# Patient Record
Sex: Male | Born: 1970
Health system: Southern US, Community
[De-identification: ages and names within clinical notes are randomized; demographics above are authoritative.]

## PROBLEM LIST (undated history)

## (undated) DIAGNOSIS — K649 Unspecified hemorrhoids: Secondary | ICD-10-CM

## (undated) DIAGNOSIS — R809 Proteinuria, unspecified: Secondary | ICD-10-CM

## (undated) DIAGNOSIS — E663 Overweight: Secondary | ICD-10-CM

## (undated) DIAGNOSIS — M2550 Pain in unspecified joint: Secondary | ICD-10-CM

## (undated) DIAGNOSIS — E7800 Pure hypercholesterolemia, unspecified: Secondary | ICD-10-CM

## (undated) DIAGNOSIS — I10 Essential (primary) hypertension: Secondary | ICD-10-CM

## (undated) DIAGNOSIS — E790 Hyperuricemia without signs of inflammatory arthritis and tophaceous disease: Secondary | ICD-10-CM

## (undated) DIAGNOSIS — M1310 Monoarthritis, not elsewhere classified, unspecified site: Secondary | ICD-10-CM

## (undated) HISTORY — DX: Overweight: E66.3

## (undated) HISTORY — DX: Essential (primary) hypertension: I10

## (undated) HISTORY — DX: Hyperuricemia without signs of inflammatory arthritis and tophaceous disease: E79.0

## (undated) HISTORY — DX: Unspecified hemorrhoids: K64.9

## (undated) HISTORY — DX: Monoarthritis, not elsewhere classified, unspecified site: M13.10

## (undated) HISTORY — DX: Pain in unspecified joint: M25.50

## (undated) HISTORY — DX: Proteinuria, unspecified: R80.9

## (undated) HISTORY — DX: Pure hypercholesterolemia, unspecified: E78.00

---

## 2019-01-03 ENCOUNTER — Telehealth: Payer: Self-pay | Admitting: *Deleted

## 2019-01-03 ENCOUNTER — Telehealth: Payer: Self-pay | Admitting: Interventional Cardiology

## 2019-01-03 NOTE — Telephone Encounter (Signed)
Contact pt about upcoming appt with Dr. Katrinka Blazing on 4/24.  Need to change to a virtual visit or move out if pt is doing ok.  Left message to call back to discuss.  Can be changed to a Doximity visit on the 24th at 11A or 12:30P if still available.

## 2019-01-03 NOTE — Telephone Encounter (Signed)
NOTES ON FILE FROM DR. RONALD POLITE [phone].

## 2019-01-04 NOTE — Telephone Encounter (Signed)
Left message to call back  

## 2019-01-05 NOTE — Telephone Encounter (Signed)
Patient set up for MyChart? Declined at this time  Is patient using Smartphone/computer/tablet? smartphone  Did audio/video work?  Does patient need telephone visit?no  Best phone number to use? [phone]  Patient does not have a bp machine at home , will have wt and  Medications ready     Virtual Visit Pre-Appointment Phone Call  "(Name), I am calling you today to discuss your upcoming appointment. We are currently trying to limit exposure to the virus that causes COVID-19 by seeing patients at home rather than in the office."  1. "What is the BEST phone number to call the day of the visit?" - include this in appointment notes  2. Do you have or have access to (through a family member/friend) a smartphone with video capability that we can use for your visit?" a. If yes - list this number in appt notes as cell (if different from BEST phone #) and list the appointment type as a VIDEO visit in appointment notes b. If no - list the appointment type as a PHONE visit in appointment notes  3. Confirm consent - "In the setting of the current Covid19 crisis, you are scheduled for a (phone or video) visit with your provider on (date) at (time).  Just as we do with many in-office visits, in order for you to participate in this visit, we must obtain consent.  If you'd like, I can send this to your mychart (if signed up) or email for you to review.  Otherwise, I can obtain your verbal consent now.  All virtual visits are billed to your insurance company just like a normal visit would be.  By agreeing to a virtual visit, we'd like you to understand that the technology does not allow for your provider to perform an examination, and thus may limit your provider's ability to fully assess your condition. If your provider identifies any concerns that need to be evaluated in person, we will make arrangements to do so.  Finally, though the technology is pretty good, we cannot assure that it will always  work on either your or our end, and in the setting of a video visit, we may have to convert it to a phone-only visit.  In either situation, we cannot ensure that we have a secure connection.  Are you willing to proceed?" STAFF: Did the patient verbally acknowledge consent to telehealth visit? Document YES/NO here: Yes   4. Advise patient to be prepared - "Two hours prior to your appointment, go ahead and check your blood pressure, pulse, oxygen saturation, and your weight (if you have the equipment to check those) and write them all down. When your visit starts, your provider will ask you for this information. If you have an Apple Watch or Kardia device, please plan to have heart rate information ready on the day of your appointment. Please have a pen and paper handy nearby the day of the visit as well."  5. Give patient instructions for MyChart download to smartphone OR Doximity/Doxy.me as below if video visit (depending on what platform provider is using)  6. Inform patient they will receive a phone call 15 minutes prior to their appointment time (may be from unknown caller ID) so they should be prepared to answer    TELEPHONE CALL NOTE  Jason [name] has been deemed a candidate for a follow-up tele-health visit to limit community exposure during the Covid-19 pandemic. I spoke with the patient via phone to ensure availability of phone/video source, confirm  preferred email & phone number, and discuss instructions and expectations.  I reminded Jason [name] to be prepared with any vital sign and/or heart rhythm information that could potentially be obtained via home monitoring, at the time of his visit. I reminded Jason [name] to expect a phone call prior to his visit.  Jason [name] 01/05/2019 9:16 AM   INSTRUCTIONS FOR DOWNLOADING THE MYCHART APP TO SMARTPHONE  - The patient must first make sure to have activated MyChart and know their login information - If Apple, go to [employer] and  type in MyChart in the search bar and download the app. If Android, ask patient to go to [employer] and type in [place] in the search bar and download the app. The app is free but as with any other app downloads, their phone may require them to verify saved payment information or Apple/Android password.  - The patient will need to then log into the app with their MyChart username and password, and select [facility] as their healthcare provider to link the account. When it is time for your visit, go to the MyChart app, find appointments, and click Begin Video Visit. Be sure to Select Allow for your device to access the Microphone and Camera for your visit. You will then be connected, and your provider will be with you shortly.  **If they have any issues connecting, or need assistance please contact MyChart service desk [phone])**  **If using a computer, in order to ensure the best quality for their visit they will need to use either of the following Internet Browsers: [employer] or Google Chrome**  IF USING DOXIMITY or DOXY.ME - The patient will receive a link just prior to their visit by text.     FULL LENGTH CONSENT FOR TELE-HEALTH VISIT   I hereby voluntarily request, consent and authorize [facility] and its employed or contracted physicians, physician assistants, nurse practitioners or other licensed health care professionals (the Practitioner), to provide me with telemedicine health care services (the Services") as deemed necessary by the treating Practitioner. I acknowledge and consent to receive the Services by the Practitioner via telemedicine. I understand that the telemedicine visit will involve communicating with the Practitioner through live audiovisual communication technology and the disclosure of certain medical information by electronic transmission. I acknowledge that I have been given the opportunity to request an in-person assessment or other  available alternative prior to the telemedicine visit and am voluntarily participating in the telemedicine visit.  I understand that I have the right to withhold or withdraw my consent to the use of telemedicine in the course of my care at any time, without affecting my right to future care or treatment, and that the Practitioner or I may terminate the telemedicine visit at any time. I understand that I have the right to inspect all information obtained and/or recorded in the course of the telemedicine visit and may receive copies of available information for a reasonable fee.  I understand that some of the potential risks of receiving the Services via telemedicine include:   Delay or interruption in medical evaluation due to technological equipment failure or disruption;  Information transmitted may not be sufficient (e.g. poor resolution of images) to allow for appropriate medical decision making by the Practitioner; and/or   In rare instances, security protocols could fail, causing a breach of personal health information.  Furthermore, I acknowledge that it is my responsibility to provide information about my medical history, conditions and care  that is complete and accurate to the best of my ability. I acknowledge that Practitioner's advice, recommendations, and/or decision may be based on factors not within their control, such as incomplete or inaccurate data provided by me or distortions of diagnostic images or specimens that may result from electronic transmissions. I understand that the practice of medicine is not an exact science and that Practitioner makes no warranties or guarantees regarding treatment outcomes. I acknowledge that I will receive a copy of this consent concurrently upon execution via email to the email address I last provided but may also request a printed copy by calling the office of [facility].    I understand that my insurance will be billed for this visit.   I have  read or had this consent read to me.  I understand the contents of this consent, which adequately explains the benefits and risks of the Services being provided via telemedicine.   I have been provided ample opportunity to ask questions regarding this consent and the Services and have had my questions answered to my satisfaction.  I give my informed consent for the services to be provided through the use of telemedicine in my medical care  By participating in this telemedicine visit I agree to the above.

## 2019-01-05 NOTE — Progress Notes (Addendum)
Virtual Visit via Video Note   This visit type was conducted due to national recommendations for restrictions regarding the COVID-19 Pandemic (e.g. social distancing) in an effort to limit this patient's exposure and mitigate transmission in our community.  Due to his co-morbid illnesses, this patient is at least at moderate risk for complications without adequate follow up.  This format is felt to be most appropriate for this patient at this time.  All issues noted in this document were discussed and addressed.  A limited physical exam was performed with this format.  Please refer to the patient's chart for his consent to telehealth for [facility].   Evaluation Performed:  Follow-up visit  Date:  07/19/2019   ID:  Jason [name], DOB Mar 06, 1971, MRN [id number]  Patient Location: Home Provider Location: Office  PCP:  Renford Dills, MD  Cardiologist:  No primary care provider on file.  Electrophysiologist:  None   Chief Complaint:  Fam Hx CAD  History of Present Illness:    Jason [name] is a 48 y.o. male with Moderate obesity, hyperlipidemia, hypertension, and positive family history of CAD (father-deceased mother- asymptomatic).  Jason [name] has no cardiac complaints.  He has a positive family history of CAD.  He is a father of 63 child, 59 years old.  He does not smoke.  He has multiple risk factors including metabolic syndrome, hypertension, hyperlipidemia, and family history.  He is obese and relatively sedentary.  He is motivated and is on medication to control both blood pressure and lipids.  He is not excepting the potential that he has diabetes even though his hemoglobin A1c is 6.5.  The patient does not have symptoms concerning for COVID-19 infection (fever, chills, cough, or new shortness of breath).    Past Medical History:  Diagnosis Date  . Arthralgia    CONCERNS OF GOUT  . Elevated blood uric acid level   . Hemorrhoids   . Hypercholesteremia   . Hypertension   .  Monoarticular arthritis   . Overweight   . Protein in urine    AGE 42  . Pure hypercholesterolemia       Current Meds  Medication Sig  . amLODipine-olmesartan (AZOR) 5-20 MG tablet Take 1 tablet by mouth daily.  Marland Kitchen aspirin EC 81 MG tablet Take 81 mg by mouth daily.  . Omega-3 Fatty Acids (FISH OIL) 1000 MG CAPS Take 1,000 mg by mouth.  . rosuvastatin (CRESTOR) 10 MG tablet Take 10 mg by mouth daily.     Allergies:   Patient has no known allergies.   Social History   Tobacco Use  . Smoking status: Never Smoker  . Smokeless tobacco: Never Used  Substance Use Topics  . Alcohol use: Yes    Comment: SOCIALLY  . Drug use: Never     Family Hx: The patient's family history includes CAD in his father; Healthy in his brother, daughter, sister, and son.  ROS:   Please see the history of present illness.    Elevated uric acid and history of gout. All other systems reviewed and are negative.   Prior CV studies:   The following studies were reviewed today:  No imaging or functional data.  Labs/Other Tests and Data Reviewed:    EKG:  No ECG reviewed.  Recent Labs: No results found for requested labs within last 8760 hours.   Recent Lipid Panel No results found for: CHOL, TRIG, HDL, CHOLHDL, LDLCALC, LDLDIRECT  Wt Readings from Last 3 Encounters:  01/06/19 225 lb (  102.1 kg)     Objective:    Vital Signs:  BP 133/84   Pulse 78   Ht 5\' 9"  (1.753 m)   Wt 225 lb (102.1 kg)   BMI 33.23 kg/m    VITAL SIGNS:  reviewed GEN:  no acute distress RESPIRATORY:  normal respiratory effort, symmetric expansion NEURO:  alert and oriented x 3, no obvious focal deficit  ASSESSMENT & PLAN:    1. Essential hypertension   2. Family history of early CAD   3. Hyperlipidemia with target LDL less than 70   4. Metabolic syndrome   5. Educated About Covid-19 Virus Infection    PLAN:  1. Target 130/80 mmHg or less. 2. Strong family history of hyperlipidemia and coronary artery  disease.  Primary prevention is our goal. 3. LDL target less than 70. 4. Increase aerobic activity and weight loss are encouraged. 5. The 3W's were discussed.  Overall education and awareness concerning primary risk prevention was discussed in detail: LDL less than 70, hemoglobin A1c less than 7, blood pressure target less than 130/80 mmHg, >150 minutes of moderate aerobic activity per week, avoidance of smoking, weight control (via diet and exercise), and continued surveillance/management of/for obstructive sleep apnea.   COVID-19 Education: The signs and symptoms of COVID-19 were discussed with the patient and how to seek care for testing (follow up with PCP or arrange E-visit).  The importance of social distancing was discussed today.  Time:   Today, I have spent 20 minutes with the patient with telehealth technology discussing the above problems.     Medication Adjustments/Labs and Tests Ordered: Current medicines are reviewed at length with the patient today.  Concerns regarding medicines are outlined above.   Tests Ordered: No orders of the defined types were placed in this encounter.   Medication Changes: No orders of the defined types were placed in this encounter.   Disposition:  Follow up in 1 year(s)  Signed, Lesleigh NoeHenry W Gizzelle Lacomb III, MD  07/19/2019 12:52 PM    [facility]

## 2019-01-06 ENCOUNTER — Telehealth (INDEPENDENT_AMBULATORY_CARE_PROVIDER_SITE_OTHER): Payer: Managed Care, Other (non HMO) | Admitting: Interventional Cardiology

## 2019-01-06 ENCOUNTER — Other Ambulatory Visit: Payer: Self-pay

## 2019-01-06 VITALS — BP 133/84 | HR 78 | Ht 69.0 in | Wt 225.0 lb

## 2019-01-06 DIAGNOSIS — E785 Hyperlipidemia, unspecified: Secondary | ICD-10-CM | POA: Diagnosis not present

## 2019-01-06 DIAGNOSIS — Z8249 Family history of ischemic heart disease and other diseases of the circulatory system: Secondary | ICD-10-CM

## 2019-01-06 DIAGNOSIS — I10 Essential (primary) hypertension: Secondary | ICD-10-CM | POA: Diagnosis not present

## 2019-01-06 DIAGNOSIS — E8881 Metabolic syndrome: Secondary | ICD-10-CM | POA: Diagnosis not present

## 2019-01-06 DIAGNOSIS — Z7189 Other specified counseling: Secondary | ICD-10-CM

## 2019-01-06 NOTE — Patient Instructions (Signed)
Medication Instructions:  Your physician recommends that you continue on your current medications as directed. Please refer to the Current Medication list given to you today.  If you need a refill on your cardiac medications before your next appointment, please call your pharmacy.   Lab work: None If you have labs (blood work) drawn today and your tests are completely normal, you will receive your results only by: Marland Kitchen MyChart Message (if you have MyChart) OR . A paper copy in the mail If you have any lab test that is abnormal or we need to change your treatment, we will call you to review the results.  Testing/Procedures: None  Follow-Up:  Your physician recommends that you schedule a follow-up appointment before the end of the year with one of our nurse practitioners so we can obtain an EKG and in person evaluation.    At [facility], you and your health needs are our priority.  As part of our continuing mission to provide you with exceptional heart care, we have created designated Provider Care Teams.  These Care Teams include your primary Cardiologist (physician) and Advanced Practice Providers (APPs -  Physician Assistants and Nurse Practitioners) who all work together to provide you with the care you need, when you need it. You will need a follow up appointment in 12 months.  Please call our office 2 months in advance to schedule this appointment.  You may see Dr. Katrinka Blazing or one of the following Advanced Practice Providers on your designated Care Team:   Norma Fredrickson, NP Nada Boozer, NP . Georgie Chard, NP  Any Other Special Instructions Will Be Listed Below (If Applicable).

## 2019-01-06 NOTE — Telephone Encounter (Signed)
Late entry:  Called pt yesterday and left message that appt time is going to be 12:30pm as 9:40am was not an actual time slot.  Advised to call back if this didn't work for the pt.

## 2019-01-16 ENCOUNTER — Telehealth: Payer: Self-pay | Admitting: Interventional Cardiology

## 2019-01-16 NOTE — Telephone Encounter (Signed)
Called Dr. Idelle Crouch office in regards to recent labs we had received.  Spoke with Dennie Bible in medical records and she states that 2020 labs that are blank are because he will not have those drawn until June.  She transferred me to [facility], CMA for Dr. Nehemiah Settle to inquire about if anything had been done about abnormal labs in 2019.  Left detailed message on Darlene's VM.

## 2019-01-16 NOTE — Telephone Encounter (Signed)
Spoke with Carlyon Shadow and she states labs were repeated in Feb 2020.  She is going to resend them since everything did not come through correctly.  When labs were drawn in 2019, pt was changed from Atorvastatin to Rosuvastatin 32m QD.  Nothing done in regards to elevated Crea.  Prednisone and Colchicine given for elevared ESR and Uric Acid.

## 2019-04-25 ENCOUNTER — Telehealth: Payer: Self-pay | Admitting: *Deleted

## 2019-04-25 NOTE — Telephone Encounter (Signed)
lvm for pt that upcoming appt has been moved to the next day due to Virtual visits the day the pt was scheduled.

## 2019-05-16 ENCOUNTER — Ambulatory Visit: Payer: Self-pay | Admitting: Nurse Practitioner

## 2019-05-17 ENCOUNTER — Ambulatory Visit: Payer: Self-pay | Admitting: Nurse Practitioner

## 2019-07-19 NOTE — Progress Notes (Signed)
Cardiology Office Note:    Date:  07/20/2019   ID:  Jason [name], DOB 02-Jul-1971, MRN [id number]  PCP:  Seward Carol, MD  Cardiologist:  No primary care provider on file.   Referring MD: Seward Carol, MD   Chief Complaint  Patient presents with  . Hypertension  . Hyperlipidemia  . Advice Only    Family history of vascular disease    History of Present Illness:    Jason [name] is a 48 y.o. male with a hx of Moderate obesity, hyperlipidemia, hypertension, and positive family history of CAD (father-deceased mother- asymptomatic).Jason [name] is in today for an in person office visit.  The first encounter was several months ago as a virtual visit during the still existent Covid 19 pandemic.  He feels well and denies angina, chest pain, dyspnea on exertion, peripheral edema, palpitations, syncope, orthopnea, and physical activity intolerance.  He does snore.  He occasionally awakens at night to urinate.  He does not smoke cigarettes.  He does have the metabolic syndrome.  Most recent hemoglobin A1c was 6.5.  Past Medical History:  Diagnosis Date  . Arthralgia    CONCERNS OF GOUT  . Elevated blood uric acid level   . Hemorrhoids   . Hypercholesteremia   . Hypertension   . Monoarticular arthritis   . Overweight   . Protein in urine    AGE 48  . Pure hypercholesterolemia     History reviewed. No pertinent surgical history.  Current Medications: Current Meds  Medication Sig  . amLODipine-olmesartan (AZOR) 5-20 MG tablet Take 1 tablet by mouth daily.  Marland Kitchen aspirin EC 81 MG tablet Take 81 mg by mouth daily.  . Omega-3 Fatty Acids (FISH OIL) 1000 MG CAPS Take 1,000 mg by mouth.  . rosuvastatin (CRESTOR) 10 MG tablet Take 10 mg by mouth daily.     Allergies:   Patient has no known allergies.   Social History   Socioeconomic History  . Marital status: Married    Spouse name: Not on file  . Number of children: Not on file  . Years of education: Not on file  . Highest  education level: Not on file  Occupational History  . Not on file  Social Needs  . Financial resource strain: Not on file  . Food insecurity    Worry: Not on file    Inability: Not on file  . Transportation needs    Medical: Not on file    Non-medical: Not on file  Tobacco Use  . Smoking status: Never Smoker  . Smokeless tobacco: Never Used  Substance and Sexual Activity  . Alcohol use: Yes    Comment: SOCIALLY  . Drug use: Never  . Sexual activity: Not on file    Comment: MARRIED  Lifestyle  . Physical activity    Days per week: Not on file    Minutes per session: Not on file  . Stress: Not on file  Relationships  . Social Herbalist on phone: Not on file    Gets together: Not on file    Attends religious service: Not on file    Active member of club or organization: Not on file    Attends meetings of clubs or organizations: Not on file    Relationship status: Not on file  Other Topics Concern  . Not on file  Social History Narrative  . Not on file     Family History: The patient's family history includes CAD in  his father; Healthy in his brother, daughter, sister, and son.  ROS:   Please see the history of present illness.    Bilateral knee arthritis right greater than left.  All other systems reviewed and are negative.  EKGs/Labs/Other Studies Reviewed:    The following studies were reviewed today: No imaging data.  EKG:  EKG normal sinus rhythm with poor R wave progression V1 through V3.  Normal overall tracing.  No prior tracings available for comparison.  Recent Labs: No results found for requested labs within last 8760 hours.  Recent Lipid Panel No results found for: CHOL, TRIG, HDL, CHOLHDL, VLDL, LDLCALC, LDLDIRECT  Physical Exam:    VS:  BP (!) 144/84   Pulse 84   Ht 5\' 9"  (1.753 m)   Wt 218 lb (98.9 kg)   SpO2 97%   BMI 32.19 kg/m     Wt Readings from Last 3 Encounters:  07/20/19 218 lb (98.9 kg)  01/06/19 225 lb (102.1 kg)      GEN: Moderate obesity with BMI of 32.2 kg/m.Marland Kitchen. No acute distress HEENT: Normal NECK: No JVD. LYMPHATICS: No lymphadenopathy CARDIAC:  RRR without murmur, gallop, or edema. VASCULAR:  Normal Pulses. No bruits. RESPIRATORY:  Clear to auscultation without rales, wheezing or rhonchi  ABDOMEN: Soft, non-tender, non-distended, No pulsatile mass, MUSCULOSKELETAL: No deformity  SKIN: Warm and dry NEUROLOGIC:  Alert and oriented x 3 PSYCHIATRIC:  Normal affect   ASSESSMENT:    1. Essential hypertension   2. Hyperlipidemia with target LDL less than 70   3. Family history of early CAD   4. Metabolic syndrome   5. Snoring   6. Educated about COVID-19 virus infection    PLAN:    In order of problems listed above:  1. Blood pressure needs better control with target 130/80 mmHg.  He is using nonsteroidal anti-inflammatory therapy due to bilateral knee arthritis.  I have asked him to minimize the use of NSAIDs as much as possible, cut back on salt, continue losing weight, and exercise.  If over the next 2 to 3 months we are not consistently getting systolic pressures below 130, we will need to intensify his current regimen by adding low-dose diuretic therapy in the form of HCTZ. 2. He question the use of rosuvastatin because he read about statin therapy increasing glucose intolerance.  He is already diabetic and therefore should not be asked concerned about the use of statin therapy precipitating diabetes.  He should however have the same targets for control as a diabetic which is LDL less than 70.  We discussed the concept of burden of cholesterol exposure over time and he will clearly do better on statin therapy controlling for LDL 70 target 3. Father had multivessel coronary disease and required bypass surgery in his 4370s. 4. A1c most recently in February was 6.5 before 25 pound weight loss.  Hopefully this will be markedly improved when reevaluated by Dr. Nehemiah SettlePolite. 5. Schedule sleep study to rule  out obstructive sleep apnea.  Overall education and awareness concerning primary risk prevention was discussed in detail: LDL less than 70, hemoglobin A1c less than 7, blood pressure target less than 130/80 mmHg, >150 minutes of moderate aerobic activity per week, avoidance of smoking, weight control (via diet and exercise), and continued surveillance/management of/for obstructive sleep apnea.    Medication Adjustments/Labs and Tests Ordered: Current medicines are reviewed at length with the patient today.  Concerns regarding medicines are outlined above.  Orders Placed This Encounter  Procedures  .  EKG 12-Lead  . Split night study   No orders of the defined types were placed in this encounter.   Patient Instructions  Medication Instructions:  Your physician recommends that you continue on your current medications as directed. Please refer to the Current Medication list given to you today.  *If you need a refill on your cardiac medications before your next appointment, please call your pharmacy*  Lab Work: None If you have labs (blood work) drawn today and your tests are completely normal, you will receive your results only by: Marland Kitchen MyChart Message (if you have MyChart) OR . A paper copy in the mail If you have any lab test that is abnormal or we need to change your treatment, we will call you to review the results.  Testing/Procedures: Your physician has recommended that you have a sleep study. This test records several body functions during sleep, including: brain activity, eye movement, oxygen and carbon dioxide blood levels, heart rate and rhythm, breathing rate and rhythm, the flow of air through your mouth and nose, snoring, body muscle movements, and chest and belly movement.   Follow-Up: At [facility], you and your health needs are our priority.  As part of our continuing mission to provide you with exceptional heart care, we have created designated Provider Care Teams.   These Care Teams include your primary Cardiologist (physician) and Advanced Practice Providers (APPs -  Physician Assistants and Nurse Practitioners) who all work together to provide you with the care you need, when you need it.  Your next appointment:   12 months  The format for your next appointment:   In Person  Provider:   You may see Dr. Verdis Prime or one of the following Advanced Practice Providers on your designated Care Team:    Norma Fredrickson, NP  Nada Boozer, NP  Georgie Chard, NP   Other Instructions  Monitor your blood pressure at least twice a week for the next 6 weeks and contact the office with those readings.  Make sure you are taking the blood pressure at least 2 hours after your morning medication.      Signed, Lesleigh Noe, MD  07/20/2019 11:48 AM    [facility]

## 2019-07-20 ENCOUNTER — Encounter: Payer: Self-pay | Admitting: Interventional Cardiology

## 2019-07-20 ENCOUNTER — Ambulatory Visit: Payer: Managed Care, Other (non HMO) | Admitting: Interventional Cardiology

## 2019-07-20 ENCOUNTER — Telehealth: Payer: Self-pay | Admitting: *Deleted

## 2019-07-20 ENCOUNTER — Other Ambulatory Visit: Payer: Self-pay

## 2019-07-20 VITALS — BP 144/84 | HR 84 | Ht 69.0 in | Wt 218.0 lb

## 2019-07-20 DIAGNOSIS — Z8249 Family history of ischemic heart disease and other diseases of the circulatory system: Secondary | ICD-10-CM

## 2019-07-20 DIAGNOSIS — Z7189 Other specified counseling: Secondary | ICD-10-CM

## 2019-07-20 DIAGNOSIS — E8881 Metabolic syndrome: Secondary | ICD-10-CM | POA: Diagnosis not present

## 2019-07-20 DIAGNOSIS — E785 Hyperlipidemia, unspecified: Secondary | ICD-10-CM | POA: Diagnosis not present

## 2019-07-20 DIAGNOSIS — R0683 Snoring: Secondary | ICD-10-CM

## 2019-07-20 DIAGNOSIS — I10 Essential (primary) hypertension: Secondary | ICD-10-CM

## 2019-07-20 NOTE — Patient Instructions (Signed)
Medication Instructions:  Your physician recommends that you continue on your current medications as directed. Please refer to the Current Medication list given to you today.  *If you need a refill on your cardiac medications before your next appointment, please call your pharmacy*  Lab Work: None If you have labs (blood work) drawn today and your tests are completely normal, you will receive your results only by: Marland Kitchen MyChart Message (if you have MyChart) OR . A paper copy in the mail If you have any lab test that is abnormal or we need to change your treatment, we will call you to review the results.  Testing/Procedures: Your physician has recommended that you have a sleep study. This test records several body functions during sleep, including: brain activity, eye movement, oxygen and carbon dioxide blood levels, heart rate and rhythm, breathing rate and rhythm, the flow of air through your mouth and nose, snoring, body muscle movements, and chest and belly movement.   Follow-Up: At [facility], you and your health needs are our priority.  As part of our continuing mission to provide you with exceptional heart care, we have created designated Provider Care Teams.  These Care Teams include your primary Cardiologist (physician) and Advanced Practice Providers (APPs -  Physician Assistants and Nurse Practitioners) who all work together to provide you with the care you need, when you need it.  Your next appointment:   12 months  The format for your next appointment:   In Person  Provider:   You may see Dr. Daneen Schick or one of the following Advanced Practice Providers on your designated Care Team:    Truitt Merle, NP  Cecilie Kicks, NP  Kathyrn Drown, NP   Other Instructions  Monitor your blood pressure at least twice a week for the next 6 weeks and contact the office with those readings.  Make sure you are taking the blood pressure at least 2 hours after your morning medication.

## 2019-07-20 NOTE — Telephone Encounter (Signed)
-----   Message from Loren Racer, RN sent at 07/20/2019 12:31 PM EST ----- Sleep study ordered for pt

## 2019-07-25 ENCOUNTER — Telehealth: Payer: Self-pay | Admitting: *Deleted

## 2019-07-25 NOTE — Telephone Encounter (Signed)
PA for split night sleep study submitted to [facility] via web portal.

## 2019-08-14 ENCOUNTER — Telehealth: Payer: Self-pay | Admitting: *Deleted

## 2019-08-14 DIAGNOSIS — I10 Essential (primary) hypertension: Secondary | ICD-10-CM

## 2019-08-14 DIAGNOSIS — R0683 Snoring: Secondary | ICD-10-CM

## 2019-08-14 NOTE — Telephone Encounter (Signed)
Staff message sent to [facility] Griffith denied in lab sleep study. # provided for peer to peer.

## 2019-09-13 NOTE — Telephone Encounter (Signed)
Patient called and states Jason [name] authorized a home sleep study. Would that be ok?

## 2019-09-17 NOTE — Telephone Encounter (Signed)
Yes, home sleep study okay.

## 2019-09-19 NOTE — Addendum Note (Signed)
Addended by: Reesa Chew on: 09/19/2019 01:40 PM   Modules accepted: Orders

## 2019-09-28 NOTE — Telephone Encounter (Signed)
Informed patient of upcoming home sleep study and patient understanding was verbalized. Patient is aware of Home Sleep Study through [facility]. Patient is scheduled for 11/23/19 at 10 am to pick up home sleep kit and meet with Respiratory therapist at [address] [facility]. Patient is aware that if this appointment date and time does not work for them they should contact Artis Delay directly at [phone]. Patient is aware that a sleep packet will be sent from [facility] in week.  Left detailed message on voicemail with date and time of titration and informed patient to call back to confirm or reschedule.

## 2019-11-23 ENCOUNTER — Other Ambulatory Visit: Payer: Self-pay

## 2019-11-23 ENCOUNTER — Ambulatory Visit (HOSPITAL_BASED_OUTPATIENT_CLINIC_OR_DEPARTMENT_OTHER): Payer: Managed Care, Other (non HMO) | Attending: Cardiology | Admitting: Cardiology

## 2019-11-23 DIAGNOSIS — G4733 Obstructive sleep apnea (adult) (pediatric): Secondary | ICD-10-CM

## 2019-11-23 DIAGNOSIS — R0902 Hypoxemia: Secondary | ICD-10-CM | POA: Insufficient documentation

## 2019-11-23 DIAGNOSIS — I10 Essential (primary) hypertension: Secondary | ICD-10-CM | POA: Diagnosis not present

## 2019-11-23 DIAGNOSIS — R0683 Snoring: Secondary | ICD-10-CM | POA: Diagnosis present

## 2019-11-24 NOTE — Procedures (Signed)
  Patient Name: Jason [name], Jason [name] Date: 11/23/2019 Gender: Male D.O.B: [birth year] Age (years): 74 Referring Provider: Armanda Magic MD, ABSM Height (inches): 69 Interpreting Physician: Armanda Magic MD, ABSM Weight (lbs): 218 RPSGT: Celene Kras BMI: 32 MRN: [id number] Neck Size: 17.50  CLINICAL INFORMATION Sleep Study Type: HST  Indication for sleep study: N/A  Epworth Sleepiness Score: 2  SLEEP STUDY TECHNIQUE A multi-channel overnight portable sleep study was performed. The channels recorded were: nasal airflow, thoracic respiratory movement, and oxygen saturation with a pulse oximetry. Snoring was also monitored.  MEDICATIONS Patient self administered medications include: N/A.  SLEEP ARCHITECTURE Patient was studied for 395.3 minutes. The sleep efficiency was 99.0 % and the patient was supine for 66.6%. The arousal index was 0.0 per hour.  RESPIRATORY PARAMETERS The overall AHI was 14.0 per hour, with a central apnea index of 0.0 per hour.  The oxygen nadir was 81% during sleep.  CARDIAC DATA Mean heart rate during sleep was 68.4 bpm.  IMPRESSIONS - Mild obstructive sleep apnea occurred during this study (AHI = 14.0/h). - No significant central sleep apnea occurred during this study (CAI = 0.0/h). - Moderate oxygen desaturation was noted during this study (Min O2 = 81%). - Patient snored 7.5% during the sleep.  DIAGNOSIS - Obstructive Sleep Apnea (327.23 [G47.33 ICD-10]) - Nocturnal Hypoxemia (327.26 [G47.36 ICD-10])  RECOMMENDATIONS - Therapeutic CPAP titration to determine optimal pressure required to alleviate sleep disordered breathing. - Oral appliance may be considered. - Avoid alcohol, sedatives and other CNS depressants that may worsen sleep apnea and disrupt normal sleep architecture. - Sleep hygiene should be reviewed to assess factors that may improve sleep quality. - Weight management and regular exercise should be initiated or continued. -  Return to Sleep Center to discuss the results of this study  [Electronically signed] 11/24/2019 06:10 PM  Armanda Magic MD, ABSM Diplomate, American Board of Sleep Medicine

## 2019-11-28 ENCOUNTER — Telehealth: Payer: Self-pay | Admitting: *Deleted

## 2019-11-28 NOTE — Telephone Encounter (Signed)

## 2019-11-28 NOTE — Telephone Encounter (Addendum)
Informed patient of sleep study results. Patient understands his sleep study showed they have sleep apnea. Patient understands Dr Mayford Knife wants to set up a virtual OV to discuss results and possible treatment options. Appointment set up for 12/13/19. Pt is aware and agreeable to the appointment.

## 2019-11-28 NOTE — Telephone Encounter (Signed)
-----   Message from Quintella Reichert, MD sent at 11/24/2019  6:13 PM EST ----- Please let patient know that they have sleep apnea.  Please set up virtual OV to discuss results and possible treatment options

## 2019-12-13 ENCOUNTER — Telehealth (INDEPENDENT_AMBULATORY_CARE_PROVIDER_SITE_OTHER): Payer: Managed Care, Other (non HMO) | Admitting: Cardiology

## 2019-12-13 ENCOUNTER — Telehealth: Payer: Self-pay | Admitting: *Deleted

## 2019-12-13 ENCOUNTER — Encounter: Payer: Self-pay | Admitting: Cardiology

## 2019-12-13 ENCOUNTER — Other Ambulatory Visit: Payer: Self-pay

## 2019-12-13 VITALS — Ht 69.0 in | Wt 220.0 lb

## 2019-12-13 DIAGNOSIS — I10 Essential (primary) hypertension: Secondary | ICD-10-CM

## 2019-12-13 DIAGNOSIS — E669 Obesity, unspecified: Secondary | ICD-10-CM

## 2019-12-13 DIAGNOSIS — G4733 Obstructive sleep apnea (adult) (pediatric): Secondary | ICD-10-CM | POA: Diagnosis not present

## 2019-12-13 NOTE — Progress Notes (Signed)
Virtual Visit via Telephone Note   This visit type was conducted due to national recommendations for restrictions regarding the COVID-19 Pandemic (e.g. social distancing) in an effort to limit this patient's exposure and mitigate transmission in our community.  Due to his co-morbid illnesses, this patient is at least at moderate risk for complications without adequate follow up.  This format is felt to be most appropriate for this patient at this time.  The patient did not have access to video technology/had technical difficulties with video requiring transitioning to audio format only (telephone).  All issues noted in this document were discussed and addressed.  No physical exam could be performed with this format.  Please refer to the patient's chart for his  consent to telehealth for [facility].   Evaluation Performed:  Follow-up visit  This visit type was conducted due to national recommendations for restrictions regarding the COVID-19 Pandemic (e.g. social distancing).  This format is felt to be most appropriate for this patient at this time.  All issues noted in this document were discussed and addressed.  No physical exam was performed (except for noted visual exam findings with Video Visits).  Please refer to the patient's chart (MyChart message for video visits and phone note for telephone visits) for the patient's consent to telehealth for [facility]  Date:  12/13/2019   ID:  Jason [name], DOB [birth year], MRN [id number]  Patient Location:  Home  Provider location:   [place]  PCP:  Seward Carol, MD  Cardiologist:  Daneen Schick, MD Sleep Medicine:  Fransico Him, MD Electrophysiologist:  None   Chief Complaint:  Snoring  History of Present Illness:    Jason [name] is a 49 y.o. male who presents via audio/video conferencing for a telehealth visit today.    This is a 49yo male with a hx of obesity, HLD, HTN followed by Dr. Tamala Julian.  Due to problems with snoring he  was referred for sleep study which showed mild to moderate OSA with an AHI of 14/hr and O2 saturations as low as 81% with nocturnal hypoxemia.  He is now here to discuss the results.  He tells me that he has not had any problems with awakening gasping for breath but his wife has witnessed apneic episodes while sleeping.   The patient does not have symptoms concerning for COVID-19 infection (fever, chills, cough, or new shortness of breath).   Prior CV studies:   The following studies were reviewed today:  Sleep study  Past Medical History:  Diagnosis Date  . Arthralgia    CONCERNS OF GOUT  . Elevated blood uric acid level   . Hemorrhoids   . Hypercholesteremia   . Hypertension   . Monoarticular arthritis   . Overweight   . Protein in urine    AGE 50  . Pure hypercholesterolemia    No past surgical history on file.   Current Meds  Medication Sig  . amLODipine-olmesartan (AZOR) 5-20 MG tablet Take 1 tablet by mouth daily.  Marland Kitchen aspirin EC 81 MG tablet Take 81 mg by mouth daily.  . Omega-3 Fatty Acids (FISH OIL) 1000 MG CAPS Take 1,000 mg by mouth.  . rosuvastatin (CRESTOR) 10 MG tablet Take 10 mg by mouth daily.     Allergies:   Patient has no known allergies.   Social History   Tobacco Use  . Smoking status: Never Smoker  . Smokeless tobacco: Never Used  Substance Use Topics  . Alcohol use: Yes  Comment: SOCIALLY  . Drug use: Never     Family Hx: The patient's family history includes CAD in his father; Healthy in his brother, daughter, sister, and son.  ROS:   Please see the history of present illness.     All other systems reviewed and are negative.   Labs/Other Tests and Data Reviewed:    Recent Labs: No results found for requested labs within last 8760 hours.   Recent Lipid Panel No results found for: CHOL, TRIG, HDL, CHOLHDL, LDLCALC, LDLDIRECT  Wt Readings from Last 3 Encounters:  12/13/19 220 lb (99.8 kg)  11/23/19 219 lb (99.3 kg)  07/20/19 218  lb (98.9 kg)     Objective:    Vital Signs:  Ht 5\' 9"  (1.753 m)   Wt 220 lb (99.8 kg)   BMI 32.49 kg/m     ASSESSMENT & PLAN:    1.  OSA -he has mild to moderate OSA with an AHI of 14/hr and associated nocturnal hypoxemia with lowest O2 sat of 81%.   -he has had problems with snoring and witnessed apnea by his wife -I reviewed the mechanism of sleep apnea and its association with HTN, heart disease, pulmonary HTN, CHF, DM and CVA -given his nocturnal hypoxemia, I have recommended that we proceed with CPAP titration  2.  Obesity -I have encouraged him to get into a routine exercise program and cut back on carbs and portions.   3.  HTN -continue Amlodipine-Olmesartan 5-20mg  daily  COVID-19 Education: The signs and symptoms of COVID-19 were discussed with the patient and how to seek care for testing (follow up with PCP or arrange E-visit).  The importance of social distancing was discussed today.  Patient Risk:   After full review of this patient's clinical status, I feel that they are at least moderate risk at this time.  Time:   Today, I have spent 20 minutes on telemedeicine discussing medical problems including OSA, HTN, obesity and reviewing patient's chart including sleep study.  Medication Adjustments/Labs and Tests Ordered: Current medicines are reviewed at length with the patient today.  Concerns regarding medicines are outlined above.  Tests Ordered: No orders of the defined types were placed in this encounter.  Medication Changes: No orders of the defined types were placed in this encounter.   Disposition:  Follow up PRN after CPAP titration  Signed, , MD  12/13/2019 8:39 AM    [facility]

## 2019-12-13 NOTE — Telephone Encounter (Signed)
PA request faxed to [facility] for CPAP titration study.

## 2019-12-13 NOTE — Telephone Encounter (Signed)
  Theresia Majors, RN  Reesa Chew, CMA  From Dr. Mayford Knife,   "please send to [facility] to order a CPAp titration - PRN"

## 2019-12-13 NOTE — Telephone Encounter (Signed)
cpap titration sent to sleep pool 

## 2019-12-14 ENCOUNTER — Telehealth: Payer: Self-pay | Admitting: *Deleted

## 2019-12-14 DIAGNOSIS — G4733 Obstructive sleep apnea (adult) (pediatric): Secondary | ICD-10-CM

## 2019-12-14 NOTE — Telephone Encounter (Signed)
-----   Message from Quintella Reichert, MD sent at 12/14/2019  5:04 PM EDT ----- Regarding: RE: precert Order [facility] CPAP on auto from 4 to 18cm H2O with heated humidity and mask of choice  Traci Turner ----- Message ----- From: Reesa Chew, CMA Sent: 12/14/2019   4:49 PM EDT To: Quintella Reichert, MD Subject: FW: precert                                    CPAP titration DENIED ok'd APAP. Please write settings. Thanks ----- Message ----- From: Gaynelle Cage, CMA Sent: 12/14/2019  10:37 AM EDT To: Reesa Chew, CMA Subject: RE: precert                                    [employer] denied CPAP titration study. Notify ordering provider. May need to do APAP.  ----- Message ----- From: Reesa Chew, CMA Sent: 12/13/2019  10:00 AM EDT To: [facility] Sleep Studies Subject: precert                                        CPAP titration

## 2019-12-14 NOTE — Telephone Encounter (Signed)
Staff message sent to [facility] request for CPAP titration has been denied by [place]. Notify ordering provider. Can do peer to peer or order APAP.

## 2019-12-14 NOTE — Telephone Encounter (Addendum)
DME selection is [facility]. Patient understands he will be contacted by [facility] to set up his cpap. Patient understands to call if [facility] does not contact him with new setup in a timely manner. Patient understands they will be called once confirmation has been received from [facility] that they have received their new machine to schedule 10 week follow up appointment.  [facility] notified of new cpap order  Please add to airview Patient was grateful for the call and thanked me.

## 2020-11-28 DIAGNOSIS — Z23 Encounter for immunization: Secondary | ICD-10-CM | POA: Diagnosis not present

## 2020-11-28 DIAGNOSIS — E663 Overweight: Secondary | ICD-10-CM | POA: Diagnosis not present

## 2020-11-28 DIAGNOSIS — R7303 Prediabetes: Secondary | ICD-10-CM | POA: Diagnosis not present

## 2020-11-28 DIAGNOSIS — E7800 Pure hypercholesterolemia, unspecified: Secondary | ICD-10-CM | POA: Diagnosis not present

## 2020-11-28 DIAGNOSIS — M109 Gout, unspecified: Secondary | ICD-10-CM | POA: Diagnosis not present

## 2020-11-28 DIAGNOSIS — Z0000 Encounter for general adult medical examination without abnormal findings: Secondary | ICD-10-CM | POA: Diagnosis not present

## 2020-11-28 DIAGNOSIS — I10 Essential (primary) hypertension: Secondary | ICD-10-CM | POA: Diagnosis not present

## 2020-12-10 DIAGNOSIS — E875 Hyperkalemia: Secondary | ICD-10-CM | POA: Diagnosis not present

## 2020-12-12 NOTE — Progress Notes (Signed)
Cardiology Office Note:    Date:  12/13/2020   ID:  Jason [name], DOB [birth year], MRN [id number]  PCP:  Renford Dills, MD  Cardiologist:  Armanda Magic, MD   Referring MD: Renford Dills, MD   Chief Complaint  Patient presents with  . Hyperlipidemia  . Hypertension    History of Present Illness:    Jason [name] is a 50 y.o. male with a hx of moderate obesity, hyperlipidemia, hypertension, and positive family history of CAD (father-deceased mother- asymptomatic).  He is accompanied by his wife.  They have a lot of questions.  Jason [name] is concerned about long-term risk management.  He is not having chest pain.  He has some tingling in his arms and legs that occur when he is lying down.  He is limited in physical activity because of a bad right knee.  He is not on CPAP for sleep apnea but does have an oxygen ring that he wears.  He has not been able to tolerate even low doses of statin because of musculoskeletal complaints.  His most recent LDL was above 200.  He has a terrible family history of vascular disease.  I was his father's cardiologist.  He has also many paternal uncles who have vascular disease.  He is metabolic syndrome with elevated triglyceride, glycemic issues, hypertension, obesity.  He does not smoke cigarettes.  He is stopped taking statin therapy.  This is been recently restarted by Dr. Nehemiah Settle.  He has been on an herbal life product that has icosapent Ethel and DHA along with co-Q10.  Past Medical History:  Diagnosis Date  . Arthralgia    CONCERNS OF GOUT  . Elevated blood uric acid level   . Hemorrhoids   . Hypercholesteremia   . Hypertension   . Monoarticular arthritis   . Overweight   . Protein in urine    AGE 63  . Pure hypercholesterolemia     History reviewed. No pertinent surgical history.  Current Medications: Current Meds  Medication Sig  . amLODipine-olmesartan (AZOR) 5-20 MG tablet Take 1 tablet by mouth daily.  Marland Kitchen aspirin EC 81 MG  tablet Take 81 mg by mouth daily.  Marland Kitchen atorvastatin (LIPITOR) 10 MG tablet Take 10 mg by mouth daily.  . Omega-3 Fatty Acids (FISH OIL) 1000 MG CAPS Take 1,000 mg by mouth.     Allergies:   Rosuvastatin calcium and Simvastatin   Social History   Socioeconomic History  . Marital status: Married    Spouse name: Not on file  . Number of children: Not on file  . Years of education: Not on file  . Highest education level: Not on file  Occupational History  . Not on file  Tobacco Use  . Smoking status: Never Smoker  . Smokeless tobacco: Never Used  Substance and Sexual Activity  . Alcohol use: Yes    Comment: SOCIALLY  . Drug use: Never  . Sexual activity: Not on file    Comment: MARRIED  Other Topics Concern  . Not on file  Social History Narrative  . Not on file   Social Determinants of Health   Financial Resource Strain: Not on file  Food Insecurity: Not on file  Transportation Needs: Not on file  Physical Activity: Not on file  Stress: Not on file  Social Connections: Not on file     Family History: The patient's family history includes CAD in his father; Healthy in his brother, daughter, sister, and son.  ROS:   Please  see the history of present illness.    Low back discomfort, right knee discomfort, tingling and dysesthesias in feet.  He stands for nearly 12 hours each day at work.  All other systems reviewed and are negative.  EKGs/Labs/Other Studies Reviewed:    The following studies were reviewed today: Cardiac imaging has not been performed. Laboratory data: March 2022  LDL 209  Triglyceride 195  Total cholesterol 298  Hemoglobin A1c 6.1%  Creatinine 1.847  EKG:  EKG normal sinus rhythm with normal appearance  Recent Labs: No results found for requested labs within last 8760 hours.  Recent Lipid Panel No results found for: CHOL, TRIG, HDL, CHOLHDL, VLDL, LDLCALC, LDLDIRECT  Physical Exam:    VS:  BP (!) 142/90   Pulse 76   Ht 5\' 9"  (1.753  m)   Wt 219 lb (99.3 kg)   SpO2 95%   BMI 32.34 kg/m     Wt Readings from Last 3 Encounters:  12/13/20 219 lb (99.3 kg)  12/13/19 220 lb (99.8 kg)  11/23/19 219 lb (99.3 kg)     GEN: Overweight. No acute distress HEENT: Normal NECK: No JVD. LYMPHATICS: No lymphadenopathy CARDIAC: No murmur. RRR no gallop, or edema. VASCULAR:  Normal Pulses. No bruits. RESPIRATORY:  Clear to auscultation without rales, wheezing or rhonchi  ABDOMEN: Soft, non-tender, non-distended, No pulsatile mass, MUSCULOSKELETAL: No deformity  SKIN: Warm and dry NEUROLOGIC:  Alert and oriented x 3 PSYCHIATRIC:  Normal affect   ASSESSMENT:    1. Essential hypertension   2. Metabolic syndrome   3. Family history of early CAD   4. Hyperlipidemia with target LDL less than 70   5. OSA (obstructive sleep apnea)   6. Obesity (BMI 30-39.9)   7. Educated about COVID-19 virus infection    PLAN:    In order of problems listed above:  1. Add HCTZ 12.5 mg Monday, Wednesday, and Friday. 2. Exercise, weight loss, Mediterranean diet 3. Coronary calcium score to further stratify risk. 4. Lipids are compatible with familial hyperlipidemia.  He will need PCSK9 inhibitor therapy or Inclisirin.  Will refer to the lipid clinic. 5. He wants an ENT appointment to evaluate for upper airway disease. 6. Encouraged weight loss and exercise.  26-month follow-up for coaching  Overall education and awareness concerning primary/secondary risk prevention was discussed in detail: LDL less than 70, hemoglobin A1c less than 7, blood pressure target less than 130/80 mmHg, >150 minutes of moderate aerobic activity per week, avoidance of smoking, weight control (via diet and exercise), and continued surveillance/management of/for obstructive sleep apnea.    Medication Adjustments/Labs and Tests Ordered: Current medicines are reviewed at length with the patient today.  Concerns regarding medicines are outlined above.  Orders Placed  This Encounter  Procedures  . EKG 12-Lead   No orders of the defined types were placed in this encounter.   There are no Patient Instructions on file for this visit.   Signed, 8-month, MD  12/13/2020 8:54 AM    [facility]

## 2020-12-13 ENCOUNTER — Other Ambulatory Visit: Payer: Self-pay

## 2020-12-13 ENCOUNTER — Ambulatory Visit: Payer: BC Managed Care – PPO | Admitting: Interventional Cardiology

## 2020-12-13 ENCOUNTER — Encounter: Payer: Self-pay | Admitting: Interventional Cardiology

## 2020-12-13 VITALS — BP 142/90 | HR 76 | Ht 69.0 in | Wt 219.0 lb

## 2020-12-13 DIAGNOSIS — E8881 Metabolic syndrome: Secondary | ICD-10-CM | POA: Diagnosis not present

## 2020-12-13 DIAGNOSIS — E785 Hyperlipidemia, unspecified: Secondary | ICD-10-CM | POA: Diagnosis not present

## 2020-12-13 DIAGNOSIS — I10 Essential (primary) hypertension: Secondary | ICD-10-CM | POA: Diagnosis not present

## 2020-12-13 DIAGNOSIS — E669 Obesity, unspecified: Secondary | ICD-10-CM

## 2020-12-13 DIAGNOSIS — Z7189 Other specified counseling: Secondary | ICD-10-CM

## 2020-12-13 DIAGNOSIS — Z8249 Family history of ischemic heart disease and other diseases of the circulatory system: Secondary | ICD-10-CM

## 2020-12-13 DIAGNOSIS — G4733 Obstructive sleep apnea (adult) (pediatric): Secondary | ICD-10-CM

## 2020-12-13 MED ORDER — HYDROCHLOROTHIAZIDE 12.5 MG PO CAPS: 12.5000 mg | ORAL_CAPSULE | ORAL | 3 refills | Status: DC

## 2020-12-13 NOTE — Patient Instructions (Signed)
Medication Instructions:  Start Hydrochlorothiazide, HCTZ 12.5 mg by mouth Monday, Wednesday, and Friday   *If you need a refill on your cardiac medications before your next appointment, please call your pharmacy*   Lab Work: BMET in 2 Weeks If you have labs (blood work) drawn today and your tests are completely normal, you will receive your results only by: Marland Kitchen MyChart Message (if you have MyChart) OR . A paper copy in the mail If you have any lab test that is abnormal or we need to change your treatment, we will call you to review the results.   Testing/Procedures: Your Provider has recommended you have a Calcium Score test   Follow-Up:  You have been referred to  Lipid Clinic here in office  At [facility], you and your health needs are our priority.  As part of our continuing mission to provide you with exceptional heart care, we have created designated Provider Care Teams.  These Care Teams include your primary Cardiologist (physician) and Advanced Practice Providers (APPs -  Physician Assistants and Nurse Practitioners) who all work together to provide you with the care you need, when you need it.  We recommend signing up for the patient portal called "MyChart".  Sign up information is provided on this After Visit Summary.  MyChart is used to connect with patients for Virtual Visits (Telemedicine).  Patients are able to view lab/test results, encounter notes, upcoming appointments, etc.  Non-urgent messages can be sent to your provider as well.   To learn more about what you can do with MyChart, go to [email or web address].    Your next appointment:  6 mnths    The format for your next appointment:   In Person  Provider:   You may see Dr. Verdis Prime or one of the following Advanced Practice Providers on your designated Care Team:    Georgie Chard, NP

## 2021-01-02 ENCOUNTER — Encounter: Payer: Self-pay | Admitting: Pharmacist

## 2021-01-02 ENCOUNTER — Ambulatory Visit (INDEPENDENT_AMBULATORY_CARE_PROVIDER_SITE_OTHER): Payer: BC Managed Care – PPO | Admitting: Pharmacist

## 2021-01-02 ENCOUNTER — Other Ambulatory Visit: Payer: Self-pay

## 2021-01-02 ENCOUNTER — Other Ambulatory Visit: Payer: BC Managed Care – PPO

## 2021-01-02 DIAGNOSIS — T466X5A Adverse effect of antihyperlipidemic and antiarteriosclerotic drugs, initial encounter: Secondary | ICD-10-CM | POA: Diagnosis not present

## 2021-01-02 DIAGNOSIS — I10 Essential (primary) hypertension: Secondary | ICD-10-CM | POA: Insufficient documentation

## 2021-01-02 DIAGNOSIS — R7989 Other specified abnormal findings of blood chemistry: Secondary | ICD-10-CM | POA: Diagnosis not present

## 2021-01-02 DIAGNOSIS — E7800 Pure hypercholesterolemia, unspecified: Secondary | ICD-10-CM | POA: Diagnosis not present

## 2021-01-02 DIAGNOSIS — M109 Gout, unspecified: Secondary | ICD-10-CM | POA: Insufficient documentation

## 2021-01-02 DIAGNOSIS — E785 Hyperlipidemia, unspecified: Secondary | ICD-10-CM

## 2021-01-02 DIAGNOSIS — M1310 Monoarthritis, not elsewhere classified, unspecified site: Secondary | ICD-10-CM | POA: Insufficient documentation

## 2021-01-02 DIAGNOSIS — E663 Overweight: Secondary | ICD-10-CM | POA: Insufficient documentation

## 2021-01-02 DIAGNOSIS — M791 Myalgia, unspecified site: Secondary | ICD-10-CM

## 2021-01-02 LAB — BASIC METABOLIC PANEL
BUN/Creatinine Ratio: 11 (ref 9–20)
BUN: 16 mg/dL (ref 6–24)
CO2: 23 mmol/L (ref 20–29)
Calcium: 10 mg/dL (ref 8.7–10.2)
Chloride: 99 mmol/L (ref 96–106)
Creatinine, Ser: 1.51 mg/dL — ABNORMAL HIGH (ref 0.76–1.27)
Glucose: 86 mg/dL (ref 65–99)
Potassium: 4.7 mmol/L (ref 3.5–5.2)
Sodium: 139 mmol/L (ref 134–144)
eGFR: 55 mL/min/{1.73_m2} — ABNORMAL LOW (ref >59)

## 2021-01-02 NOTE — Patient Instructions (Addendum)
It was nice meeting you today!  We would like your LDL to be less than 70  Continue your diet changes and exercise as well as your plant sterols and atorvastatin  We will start a new medication that you will inject once every 2 weeks.  I will call you when the prior authorization is approved  We would like to recheck your cholesterol panel in about 2-3 months  Please call with any questions!  Laural Golden, PharmD, BCACP, CDCES, CPP [facility] [address] [place], [place] [address] Phone: [phone]; Fax: [phone] 01/02/2021 10:28 AM

## 2021-01-02 NOTE — Progress Notes (Signed)
Patient ID: Jason [name]                 DOB: [birth year]                    MRN: [id number]     HPI: Jason [name] is a 50 y.o. male patient referred to lipid clinic by Dr Katrinka Blazing. PMH is significant for HTN, HLD, pre DM, and gout.  Has a strong family history. Father and uncles have history of CAD.    Is intolerant to high intensity statins. Currently on atorvastatin 10mg  once daily but reports muscle pain.  Is not sure if this is related to statin or gout since he has not picked up his colchicine yet.  In past 2 years has been working on positive lifestyle changes to reduce LDL and triglycerides.  While on higher intensity statins was not able to move his arms up and down.    Two years ago triglycerides were 410.  Has reduced them to 195  Scheduled for coronary CT in May 2022.  Due for BMP today since starting HCTZ.  Current Medications: atorvastatin 10mg  daily. Various supplements from [employer] that contain plant sterols, EPA, and DHA Intolerances: rosuvastatin, simvastatin Risk Factors: Family history, familial hyperlipidemia, metabolic syndrome LDL goal: <70  Social History: Moderate alcohol intake, no tobacco   Labs:  TC 298, HDL 51, LDL calc 209, Trigs 195 (11/28/20 - atorvastatin 10mg )  Other recent Labs: A1c 6.1, Scr 1.56  Past Medical History:  Diagnosis Date  . Arthralgia    CONCERNS OF GOUT  . Elevated blood uric acid level   . Hemorrhoids   . Hypercholesteremia   . Hypertension   . Monoarticular arthritis   . Overweight   . Protein in urine    AGE 41  . Pure hypercholesterolemia     Current Outpatient Medications on File Prior to Visit  Medication Sig Dispense Refill  . amLODipine-olmesartan (AZOR) 5-20 MG tablet Take 1 tablet by mouth daily.    aspirin EC 81 MG tablet Take 81 mg by mouth daily.    11/30/20 atorvastatin (LIPITOR) 10 MG tablet Take 10 mg by mouth daily.    . hydrochlorothiazide (MICROZIDE) 12.5 MG capsule Take 1 capsule (12.5 mg total) by mouth  every Monday, Wednesday, and Friday. 45 capsule 3  . Omega-3 Fatty Acids (FISH OIL) 1000 MG CAPS Take 1,000 mg by mouth.     No current facility-administered medications on file prior to visit.    Allergies  Allergen Reactions  . Rosuvastatin Calcium Other (See Comments)  . Simvastatin Other (See Comments)    Assessment/Plan:  1. Hyperlipidemia - LDL today 209 which is above goal of <70.  Depending on coronary calcium score, goal may need to be more aggressive.  Unfortunately limited by statin myopathies.  Next step would be PCSK9i.  Using demo pen, educated patient on storage, site selection, and administration.  Patient voiced understanding but did not want to try demo pen in room.  Will reseacrh which medication plan prefers and complete PA.  Recommended patient switch from supplements to Vascepa due to better data however patient does not want to make multiple changes at one time.  Consider Vascepa in future.  Repeat lipid panel scheduled for late June.  Recheck as needed.  Wednesday, PharmD, BCACP, CDCES, CPP [facility] [address] [place], [facility] [address] Phone: [phone]; Fax: [phone] 01/02/2021 11:40 AM

## 2021-01-09 ENCOUNTER — Telehealth: Payer: Self-pay | Admitting: Pharmacist

## 2021-01-09 NOTE — Telephone Encounter (Signed)
[facility] called to answer clinical questions. Clinical questions answered.

## 2021-01-13 ENCOUNTER — Telehealth: Payer: Self-pay

## 2021-01-13 DIAGNOSIS — E7800 Pure hypercholesterolemia, unspecified: Secondary | ICD-10-CM

## 2021-01-13 MED ORDER — REPATHA SURECLICK 140 MG/ML ~~LOC~~ SOAJ: 140.0000 mg | SUBCUTANEOUS | 11 refills | Status: DC

## 2021-01-13 NOTE — Telephone Encounter (Signed)
Called and lmomed theh pt that they were approved for repatha, rx sent, instructed the pt to call if the medication is unaffordable and to complete fasting labs after 4th dose

## 2021-01-16 ENCOUNTER — Other Ambulatory Visit: Payer: Self-pay

## 2021-01-16 ENCOUNTER — Ambulatory Visit (INDEPENDENT_AMBULATORY_CARE_PROVIDER_SITE_OTHER)
Admission: RE | Admit: 2021-01-16 | Discharge: 2021-01-16 | Disposition: A | Discharge status: Home / Self Care | Payer: Self-pay | Source: Ambulatory Visit | Attending: Interventional Cardiology | Admitting: Interventional Cardiology

## 2021-01-16 DIAGNOSIS — E7800 Pure hypercholesterolemia, unspecified: Secondary | ICD-10-CM

## 2021-01-24 ENCOUNTER — Telehealth: Payer: Self-pay | Admitting: Interventional Cardiology

## 2021-01-24 NOTE — Telephone Encounter (Signed)
Patient calling to see if he supposed to continue to take the atorvastatin (LIPITOR) 10 MG tablet being that he is on Evolocumab (REPATHA SURECLICK) 140 MG/ML SOAJ  Now or is he suppose stop taking the atorvastatin

## 2021-01-24 NOTE — Telephone Encounter (Signed)
Spoke with patient.  Patient does not have severe myalgias with atorvastatin so recommended he continue.

## 2021-01-24 NOTE — Telephone Encounter (Signed)
I assume he is supposed to stay on atorvastatin

## 2021-02-20 DIAGNOSIS — H35713 Central serous chorioretinopathy, bilateral: Secondary | ICD-10-CM | POA: Diagnosis not present

## 2021-02-20 DIAGNOSIS — H353221 Exudative age-related macular degeneration, left eye, with active choroidal neovascularization: Secondary | ICD-10-CM | POA: Diagnosis not present

## 2021-02-20 DIAGNOSIS — H40003 Preglaucoma, unspecified, bilateral: Secondary | ICD-10-CM | POA: Diagnosis not present

## 2021-03-11 ENCOUNTER — Other Ambulatory Visit: Payer: BC Managed Care – PPO | Admitting: *Deleted

## 2021-03-11 ENCOUNTER — Other Ambulatory Visit: Payer: Self-pay

## 2021-03-11 DIAGNOSIS — E7800 Pure hypercholesterolemia, unspecified: Secondary | ICD-10-CM

## 2021-03-11 DIAGNOSIS — M791 Myalgia, unspecified site: Secondary | ICD-10-CM

## 2021-03-11 LAB — LIPID PANEL
Chol/HDL Ratio: 2.1 ratio (ref 0.0–5.0)
Cholesterol, Total: 103 mg/dL (ref 100–199)
HDL: 48 mg/dL (ref >39)
LDL Chol Calc (NIH): 21 mg/dL (ref 0–99)
Triglycerides: 223 mg/dL — ABNORMAL HIGH (ref 0–149)
VLDL Cholesterol Cal: 34 mg/dL (ref 5–40)

## 2021-03-14 ENCOUNTER — Telehealth: Payer: Self-pay | Admitting: Pharmacist

## 2021-03-14 NOTE — Telephone Encounter (Signed)
Spoke with patient and relayed lipid results.  Patient pleased.  Does not want to try Vascepa yet at this point.  Wants to stop atorvastatin at this point due to underlying myalgias. Advised we would prefer him stay on it but he can stop if he wishes but recommended we recheck lipid panel in 3 months to see if LDL increases. Patient voiced understanding.

## 2021-03-14 NOTE — Addendum Note (Signed)
Addended by: Cheree Ditto on: 03/14/2021 12:02 PM   Modules accepted: Orders

## 2021-03-25 DIAGNOSIS — J3089 Other allergic rhinitis: Secondary | ICD-10-CM | POA: Diagnosis not present

## 2021-03-25 DIAGNOSIS — J342 Deviated nasal septum: Secondary | ICD-10-CM | POA: Diagnosis not present

## 2021-03-25 DIAGNOSIS — G4733 Obstructive sleep apnea (adult) (pediatric): Secondary | ICD-10-CM | POA: Diagnosis not present

## 2021-03-25 DIAGNOSIS — R1310 Dysphagia, unspecified: Secondary | ICD-10-CM | POA: Diagnosis not present

## 2021-03-27 DIAGNOSIS — L821 Other seborrheic keratosis: Secondary | ICD-10-CM | POA: Diagnosis not present

## 2021-03-27 DIAGNOSIS — L578 Other skin changes due to chronic exposure to nonionizing radiation: Secondary | ICD-10-CM | POA: Diagnosis not present

## 2021-03-27 DIAGNOSIS — D225 Melanocytic nevi of trunk: Secondary | ICD-10-CM | POA: Diagnosis not present

## 2021-03-27 DIAGNOSIS — D485 Neoplasm of uncertain behavior of skin: Secondary | ICD-10-CM | POA: Diagnosis not present

## 2021-03-27 DIAGNOSIS — L219 Seborrheic dermatitis, unspecified: Secondary | ICD-10-CM | POA: Diagnosis not present

## 2021-03-28 ENCOUNTER — Telehealth: Payer: Self-pay | Admitting: Interventional Cardiology

## 2021-03-28 NOTE — Telephone Encounter (Signed)
[name] is calling stating Jason [name] tested positive for Covid and is wanting to know what Dr. Katrinka Blazing recommends due to the symptoms occurring very quickly for him. Please advise.

## 2021-03-28 NOTE — Telephone Encounter (Signed)
Spoke with wife and made her aware no interactions with medications.  She will contact PCP to see what they would like to prescribe.

## 2021-03-28 NOTE — Telephone Encounter (Signed)
Called wife and left message to call back.

## 2021-03-28 NOTE — Telephone Encounter (Signed)
Pharmacy- I plan to advise pt to call PCP but wanted to check with you all if there are any medication interactions with any of the covid meds they are prescribing.

## 2021-03-28 NOTE — Telephone Encounter (Signed)
Patient's wife is returning call about husband having covid.

## 2021-03-28 NOTE — Telephone Encounter (Signed)
No drug drug interactions noted

## 2021-04-25 ENCOUNTER — Telehealth: Payer: Self-pay | Admitting: Pharmacist

## 2021-04-25 NOTE — Telephone Encounter (Signed)
Patient called stating that his repatha was $400 with his copay card. I called his pharmacy to make sure they were billing insurance. Copay card is pay $200. Insurance really isn't paying anything. Will need to see why insurance isn't picking up cost. Patient doesn't think he has a high deductible. Will have to call insurance next week. If its a deductible issue, will need to see about switching to Praluent if they will cover.

## 2021-04-28 NOTE — Telephone Encounter (Signed)
Please keep pt on Repatha and set him up with a $5 copay card at his pharmacy, thank you!

## 2021-04-28 NOTE — Telephone Encounter (Signed)
I called the pharmacy to make sure that everything was all good and they said that yes its going through at $5

## 2021-04-28 NOTE — Telephone Encounter (Signed)
Called and lmomed the pt that the repatha should be all good now for the $5 pick up and to call us if they have more issues

## 2021-04-28 NOTE — Telephone Encounter (Signed)
I called the [facility] prior auth line and they stated that the pt has also been approved for praluent but I was instructed to call for repatha and they stated that they were approved but apparently the approval wasn't attaching to the claim. They ran a test claim and stated that it rejected. But then they fixed the issue to where the pt should only have to pay $50. I will route to [facility] to see if she wants to stay on repatha or switch to praluent and get the pt registered for a copay card for the praluent. They stated that the pharmacy will show that the next fill isn't available until 05/16/21 as they were showing a paid claim.

## 2021-05-21 DIAGNOSIS — B079 Viral wart, unspecified: Secondary | ICD-10-CM | POA: Diagnosis not present

## 2021-05-21 DIAGNOSIS — L821 Other seborrheic keratosis: Secondary | ICD-10-CM | POA: Diagnosis not present

## 2021-05-21 DIAGNOSIS — L609 Nail disorder, unspecified: Secondary | ICD-10-CM | POA: Diagnosis not present

## 2021-05-22 NOTE — Progress Notes (Signed)
Cardiology Office Note:    Date:  05/23/2021   ID:  Jason [name], DOB [birth year], MRN [id number]  PCP:  Renford Dills, MD  Cardiologist:  Lesleigh Noe, MD   Referring MD: Renford Dills, MD   Chief Complaint  Patient presents with   Coronary Artery Disease   Hyperlipidemia    History of Present Illness:    Jason [name] is a 50 y.o. male with a hx of moderate obesity, hyperlipidemia, metabolic syndrome, primary hypertension, positive family history of CAD (father-deceased mother- asymptomatic), and asymptomatic CAD.Marland Kitchen   Currently on Repatha with excellent LDL response. He has high risk CAC score > 300.  He is losing weight.  He feels well.  He denies chest discomfort typical or atypical.  Denies orthopnea, PND, ankle edema, and medication side effects.  We discussed primary prevention.  We discussed the importance of exercise.  Past Medical History:  Diagnosis Date   Arthralgia    CONCERNS OF GOUT   Elevated blood uric acid level    Hemorrhoids    Hypercholesteremia    Hypertension    Monoarticular arthritis    Overweight    Protein in urine    AGE 20   Pure hypercholesterolemia     History reviewed. No pertinent surgical history.  Current Medications: Current Meds  Medication Sig   amLODipine-olmesartan (AZOR) 5-20 MG tablet Take 1 tablet by mouth daily.   aspirin EC 81 MG tablet Take 81 mg by mouth daily.   azelastine (ASTELIN) 0.1 % nasal spray Place 1 spray into both nostrils as needed.   Colchicine 0.6 MG CAPS Take 1 tablet by mouth daily.   Diclofenac Sodium 3 % GEL 1 application by Other route 2 (two) times daily as needed.   Evolocumab (REPATHA SURECLICK) 140 MG/ML SOAJ Inject 140 mg into the skin every 14 (fourteen) days.   hydrochlorothiazide (MICROZIDE) 12.5 MG capsule Take 1 capsule (12.5 mg total) by mouth every Monday, Wednesday, and Friday.   ketoconazole (NIZORAL) 2 % shampoo as needed.   Krill Oil 300 MG CAPS Take 1 each by mouth.    Omega-3 Fatty Acids (FISH OIL) 1000 MG CAPS Take 1,500 mg by mouth.   Plant Sterols and Stanols (CHOLESTOFF PLUS) 450 MG CAPS Take 2 tablets by mouth.     Allergies:   Rosuvastatin calcium and Simvastatin   Social History   Socioeconomic History   Marital status: Married    Spouse name: Not on file   Number of children: Not on file   Years of education: Not on file   Highest education level: Not on file  Occupational History   Not on file  Tobacco Use   Smoking status: Never   Smokeless tobacco: Never  Substance and Sexual Activity   Alcohol use: Yes    Comment: SOCIALLY   Drug use: Never   Sexual activity: Not on file    Comment: MARRIED  Other Topics Concern   Not on file  Social History Narrative   Not on file   Social Determinants of Health   Financial Resource Strain: Not on file  Food Insecurity: Not on file  Transportation Needs: Not on file  Physical Activity: Not on file  Stress: Not on file  Social Connections: Not on file     Family History: The patient's family history includes CAD in his father; Healthy in his brother, daughter, sister, and son.  ROS:   Please see the history of present illness.    Some orthopedic  issues.  Has mild sleep apnea that at this point all other systems reviewed and are negative.  EKGs/Labs/Other Studies Reviewed:    The following studies were reviewed today:  RECENT LABS 02/2021: See below the June 2022 results.  Sleep study March 2021: Per Dr. Norris Cross note of 12/13/2019 the patient has mild to moderate obstructive sleep apnea with an AHI of 14/h and O2 saturations as low as 81% with nocturnal hypoxemia.  She recommended CPAP titration which was never done and has not been followed up upon.  We will discuss this with the patient at the next visit.  He gave the impression today that therapy was optional.  CORONARY CALCIUM SCORE 01/16/2021: IMPRESSION: Coronary calcium score of 393. This was 99th percentile for  age-, race-, and sex-matched controls.    EKG:  EKG did not repeat  Recent Labs: 01/02/2021: BUN 16; Creatinine, Ser 1.51; Potassium 4.7; Sodium 139  Recent Lipid Panel    Component Value Date/Time   CHOL 103 03/11/2021 0931   TRIG 223 (H) 03/11/2021 0931   HDL 48 03/11/2021 0931   CHOLHDL 2.1 03/11/2021 0931   LDLCALC 21 03/11/2021 0931    Physical Exam:    VS:  BP 124/90   Pulse 78   Ht 5\' 9"  (1.753 m)   Wt 218 lb 9.6 oz (99.2 kg)   SpO2 98%   BMI 32.28 kg/m     Wt Readings from Last 3 Encounters:  05/23/21 218 lb 9.6 oz (99.2 kg)  12/13/20 219 lb (99.3 kg)  12/13/19 220 lb (99.8 kg)     GEN: Moderate obesity. No acute distress HEENT: Normal NECK: No JVD. LYMPHATICS: No lymphadenopathy CARDIAC: No murmur. RRR no gallop, or edema. VASCULAR: normal Pulses. No bruits. RESPIRATORY:  Clear to auscultation without rales, wheezing or rhonchi  ABDOMEN: Soft, non-tender, non-distended, No pulsatile mass, MUSCULOSKELETAL: No deformity  SKIN: Warm and dry NEUROLOGIC:  Alert and oriented x 3 PSYCHIATRIC:  Normal affect   ASSESSMENT:    1. Elevated coronary artery calcium score   2. Hyperlipidemia with target LDL less than 70   3. Primary hypertension   4. OSA (obstructive sleep apnea)   5. Metabolic syndrome   6. Obesity (BMI 30-39.9)    PLAN:    In order of problems listed above:  Primary prevention.  PCSK9 therapy initiated earlier this year.  LDL is now 21.  Continue same therapy.  Advise greater than 150 minutes of moderate activity per week and sodium restriction. See above. See above. CPAP as recommended by Dr. 12/15/19 but this was not followed up upon by the patient as he felt therapy was optional.  He is working on weight loss rather than the inconvenience of having to wear CPAP. Exercise and weight loss.  Hemoglobin A1c needs to be Less than 7. Weight loss is occurring.   Overall education and awareness concerning primary risk prevention was discussed  in detail: LDL less than 70, hemoglobin A1c less than 7, blood pressure target less than 130/80 mmHg, >150 minutes of moderate aerobic activity per week, avoidance of smoking, weight control (via diet and exercise), and continued surveillance/management of/for obstructive sleep apnea.    Medication Adjustments/Labs and Tests Ordered: Current medicines are reviewed at length with the patient today.  Concerns regarding medicines are outlined above.  No orders of the defined types were placed in this encounter.  No orders of the defined types were placed in this encounter.   Patient Instructions  Medication Instructions:  Your  physician recommends that you continue on your current medications as directed. Please refer to the Current Medication list given to you today.  *If you need a refill on your cardiac medications before your next appointment, please call your pharmacy*   Lab Work: None If you have labs (blood work) drawn today and your tests are completely normal, you will receive your results only by: MyChart Message (if you have MyChart) OR A paper copy in the mail If you have any lab test that is abnormal or we need to change your treatment, we will call you to review the results.   Testing/Procedures: None   Follow-Up: At [facility], you and your health needs are our priority.  As part of our continuing mission to provide you with exceptional heart care, we have created designated Provider Care Teams.  These Care Teams include your primary Cardiologist (physician) and Advanced Practice Providers (APPs -  Physician Assistants and Nurse Practitioners) who all work together to provide you with the care you need, when you need it.  We recommend signing up for the patient portal called "MyChart".  Sign up information is provided on this After Visit Summary.  MyChart is used to connect with patients for Virtual Visits (Telemedicine).  Patients are able to view lab/test results,  encounter notes, upcoming appointments, etc.  Non-urgent messages can be sent to your provider as well.   To learn more about what you can do with MyChart, go to [email or web address].    Your next appointment:   9-12 month(s)  The format for your next appointment:   In Person  Provider:   You may see Lesleigh Noe, MD or one of the following Advanced Practice Providers on your designated Care Team:   Nada Boozer, NP   Other Instructions     Signed, Lesleigh Noe, MD  05/23/2021 9:33 AM    [facility]

## 2021-05-23 ENCOUNTER — Other Ambulatory Visit: Payer: Self-pay

## 2021-05-23 ENCOUNTER — Ambulatory Visit: Payer: BC Managed Care – PPO | Admitting: Interventional Cardiology

## 2021-05-23 ENCOUNTER — Encounter: Payer: Self-pay | Admitting: Interventional Cardiology

## 2021-05-23 VITALS — BP 124/90 | HR 78 | Ht 69.0 in | Wt 218.6 lb

## 2021-05-23 DIAGNOSIS — I10 Essential (primary) hypertension: Secondary | ICD-10-CM

## 2021-05-23 DIAGNOSIS — G4733 Obstructive sleep apnea (adult) (pediatric): Secondary | ICD-10-CM

## 2021-05-23 DIAGNOSIS — E8881 Metabolic syndrome: Secondary | ICD-10-CM

## 2021-05-23 DIAGNOSIS — E785 Hyperlipidemia, unspecified: Secondary | ICD-10-CM | POA: Diagnosis not present

## 2021-05-23 DIAGNOSIS — R931 Abnormal findings on diagnostic imaging of heart and coronary circulation: Secondary | ICD-10-CM

## 2021-05-23 DIAGNOSIS — E669 Obesity, unspecified: Secondary | ICD-10-CM

## 2021-05-23 NOTE — Patient Instructions (Signed)
Medication Instructions:  Your physician recommends that you continue on your current medications as directed. Please refer to the Current Medication list given to you today.  *If you need a refill on your cardiac medications before your next appointment, please call your pharmacy*   Lab Work: None If you have labs (blood work) drawn today and your tests are completely normal, you will receive your results only by: MyChart Message (if you have MyChart) OR A paper copy in the mail If you have any lab test that is abnormal or we need to change your treatment, we will call you to review the results.   Testing/Procedures: None   Follow-Up: At [facility], you and your health needs are our priority.  As part of our continuing mission to provide you with exceptional heart care, we have created designated Provider Care Teams.  These Care Teams include your primary Cardiologist (physician) and Advanced Practice Providers (APPs -  Physician Assistants and Nurse Practitioners) who all work together to provide you with the care you need, when you need it.  We recommend signing up for the patient portal called "MyChart".  Sign up information is provided on this After Visit Summary.  MyChart is used to connect with patients for Virtual Visits (Telemedicine).  Patients are able to view lab/test results, encounter notes, upcoming appointments, etc.  Non-urgent messages can be sent to your provider as well.   To learn more about what you can do with MyChart, go to [email or web address].    Your next appointment:   9-12 month(s)  The format for your next appointment:   In Person  Provider:   You may see Henry W Smith III, MD or one of the following Advanced Practice Providers on your designated Care Team:   Laura Ingold, NP   Other Instructions   

## 2021-06-05 DIAGNOSIS — D489 Neoplasm of uncertain behavior, unspecified: Secondary | ICD-10-CM | POA: Diagnosis not present

## 2021-06-05 DIAGNOSIS — D2361 Other benign neoplasm of skin of right upper limb, including shoulder: Secondary | ICD-10-CM | POA: Diagnosis not present

## 2021-06-05 DIAGNOSIS — L570 Actinic keratosis: Secondary | ICD-10-CM | POA: Diagnosis not present

## 2021-06-05 DIAGNOSIS — L989 Disorder of the skin and subcutaneous tissue, unspecified: Secondary | ICD-10-CM | POA: Diagnosis not present

## 2021-06-24 DIAGNOSIS — L609 Nail disorder, unspecified: Secondary | ICD-10-CM | POA: Diagnosis not present

## 2021-06-26 ENCOUNTER — Other Ambulatory Visit: Payer: Self-pay | Admitting: Pharmacist

## 2021-06-26 DIAGNOSIS — H35713 Central serous chorioretinopathy, bilateral: Secondary | ICD-10-CM | POA: Diagnosis not present

## 2021-06-26 DIAGNOSIS — H40003 Preglaucoma, unspecified, bilateral: Secondary | ICD-10-CM | POA: Diagnosis not present

## 2021-06-26 DIAGNOSIS — H353221 Exudative age-related macular degeneration, left eye, with active choroidal neovascularization: Secondary | ICD-10-CM | POA: Diagnosis not present

## 2021-06-26 MED ORDER — REPATHA SURECLICK 140 MG/ML ~~LOC~~ SOAJ: 140.0000 mg | SUBCUTANEOUS | 11 refills | Status: DC

## 2021-07-18 ENCOUNTER — Telehealth: Payer: Self-pay | Admitting: Interventional Cardiology

## 2021-07-18 NOTE — Telephone Encounter (Signed)
New Message:    Patient said he had a coupon for his Repatha. His pharmacy [facility] [place] would not accept it. Would you please send the Coupon to [facility] at [address].

## 2021-07-21 NOTE — Telephone Encounter (Addendum)
Pt updated on what is going on with copay card. He knows to call back if the pharmacy does not resolve the issue in a few days.

## 2021-07-21 NOTE — Telephone Encounter (Signed)
I called pt and LVM I called his pharmacy and asked them what the issue was. It looks like it is going through both insurance and copay card but cost is still high. (Card only covers $200/month) It looks like we renewed PA recently. The first time we tried to fill there was an issue with PA linking to claim. We were able to call insurance and get it fixed. I asked the pharmacy to call his insurance to see why his copay is not $50 and see if issue can be fixed.

## 2021-07-22 NOTE — Telephone Encounter (Signed)
pts wife on the line calling in regards to the high copay w/ Repatha... I do see that your  note states pharmacy is to resolve in a few days. Do you have an est turn around time I can provide as to when they should try to go back and pick up the medication?

## 2021-07-22 NOTE — Telephone Encounter (Signed)
I called pharmacy who told me they would call insurance since insurance is not covering medication and we have an active PA. I called wife and advised her that per pharmacist she was going to call insurance. If issue is not resolved I asked the wife to call me back.

## 2021-07-23 NOTE — Telephone Encounter (Signed)
I spent 1hr on the phone with patients insurance. After a run around, they stated that patients new prior auth approved 06/25/21 was for a plan exclusion. I asked if Praluent was on the formulary but they stated it was not. They suggested we do a tier exception.  I have updated patient- left VM per [facility]

## 2021-07-24 NOTE — Telephone Encounter (Signed)
Tier exception faxed

## 2021-08-05 NOTE — Telephone Encounter (Signed)
Spoke to patient who states he was able to get his Repatha for $10. Insurance issue appears to be fixed.

## 2021-08-19 ENCOUNTER — Ambulatory Visit: Payer: BC Managed Care – PPO

## 2021-09-17 DIAGNOSIS — D2111 Benign neoplasm of connective and other soft tissue of right upper limb, including shoulder: Secondary | ICD-10-CM | POA: Diagnosis not present

## 2021-09-17 DIAGNOSIS — L988 Other specified disorders of the skin and subcutaneous tissue: Secondary | ICD-10-CM | POA: Diagnosis not present

## 2021-09-17 DIAGNOSIS — L609 Nail disorder, unspecified: Secondary | ICD-10-CM | POA: Diagnosis not present

## 2021-09-17 DIAGNOSIS — R2231 Localized swelling, mass and lump, right upper limb: Secondary | ICD-10-CM | POA: Diagnosis not present

## 2021-09-17 DIAGNOSIS — M898X4 Other specified disorders of bone, hand: Secondary | ICD-10-CM | POA: Diagnosis not present

## 2021-09-25 DIAGNOSIS — H40003 Preglaucoma, unspecified, bilateral: Secondary | ICD-10-CM | POA: Diagnosis not present

## 2021-09-25 DIAGNOSIS — H353222 Exudative age-related macular degeneration, left eye, with inactive choroidal neovascularization: Secondary | ICD-10-CM | POA: Diagnosis not present

## 2021-09-25 DIAGNOSIS — H35713 Central serous chorioretinopathy, bilateral: Secondary | ICD-10-CM | POA: Diagnosis not present

## 2021-11-10 ENCOUNTER — Telehealth: Payer: Self-pay | Admitting: Pharmacist

## 2021-11-10 NOTE — Telephone Encounter (Signed)
PA submitted to new insurance

## 2021-11-10 NOTE — Telephone Encounter (Signed)
Pt c/o medication issue:  1. Name of Medication:  Evolocumab (REPATHA SURECLICK) 140 MG/ML SOAJ  2. How are you currently taking this medication (dosage and times per day)? As prescribed   3. Are you having a reaction (difficulty breathing--STAT)? No   4. What is your medication issue? Patient is calling stating he has had a change in insurance so his pharmacy has changed as well. His new pharmacy is [facility]. He is needing PA for the new pharmacy and is also wanting to know if his discount card will transfer over as well. Please advise.

## 2021-11-11 NOTE — Telephone Encounter (Signed)
Looks like someone else also submitted PA and it was denied. Insurance called asking appeals questions. I requested that they send written questions so that I can fax back.

## 2021-11-12 NOTE — Telephone Encounter (Signed)
Clinical appeals questions received and completed, faxed back to [employer] along with chart notes and labs. ?

## 2021-11-13 ENCOUNTER — Other Ambulatory Visit (HOSPITAL_BASED_OUTPATIENT_CLINIC_OR_DEPARTMENT_OTHER): Payer: Self-pay

## 2021-11-13 MED ORDER — REPATHA SURECLICK 140 MG/ML ~~LOC~~ SOAJ
140.0000 mg | SUBCUTANEOUS | 11 refills | Status: DC
  Filled 2021-11-13: qty 2, 28d supply, fill #0
  Filled 2021-11-13: qty 2, 30d supply, fill #0
  Filled 2021-12-08: qty 2, 28d supply, fill #0

## 2021-11-13 NOTE — Telephone Encounter (Signed)
Called and spoke w/pharmacy and 1st they stated that the needed new pa and I stated that it was approved. They reran it and proceeded to tell me that they need to fill it at a [facility].routing to [facility] as I do not know next steps. ?Copay card was also obtained by previousl pharmacy [facility] and is as follows: ?Id [id number] ?Bin [id number] ?Pcn cn ?Group [id number] ?

## 2021-11-13 NOTE — Telephone Encounter (Signed)
Called and LVM for patient as im not sure what pharmacy patient needs his Rx sent to. ?

## 2021-11-13 NOTE — Telephone Encounter (Signed)
Patient needed rx at [facility]. He said he picked up this AM. ?I have updated him pharmacy list. ?

## 2021-11-13 NOTE — Telephone Encounter (Signed)
[employer] left VM that there was already an approval on file. Pt approved 11/10/21 through 05/10/22. ?For future PA- Pt indication is FH ?

## 2021-12-08 ENCOUNTER — Other Ambulatory Visit (HOSPITAL_BASED_OUTPATIENT_CLINIC_OR_DEPARTMENT_OTHER): Payer: Self-pay

## 2021-12-30 ENCOUNTER — Other Ambulatory Visit: Payer: Self-pay | Admitting: Interventional Cardiology

## 2022-01-12 ENCOUNTER — Other Ambulatory Visit: Payer: Self-pay | Admitting: *Deleted

## 2022-01-12 MED ORDER — HYDROCHLOROTHIAZIDE 12.5 MG PO CAPS: 12.5000 mg | ORAL_CAPSULE | ORAL | 1 refills | Status: DC

## 2022-03-02 IMAGING — CT CT CARDIAC CORONARY ARTERY CALCIUM SCORE
3 series · 14 of 20 positions shown, 15 images · non-contrast
Comparison: None.
COMPARISON: None.

Addendum:
EXAM:
OVER-READ INTERPRETATION  CT CHEST

The following report is an over-read performed by radiologist Dr.
Shamir Sakamoto [REDACTED] on 01/16/2021. This
over-read does not include interpretation of cardiac or coronary
anatomy or pathology. The coronary calcium score interpretation by
the cardiologist is attached.
CLINICAL DATA: 50M with hypertension, hyperlipidemia and family
history of CAD for cardiovascular disease risk stratification
Coronary Calcium Score
TECHNIQUE: A gated, non-contrast computed tomography scan of the heart was
performed using 3mm slice thickness. Axial images were analyzed on a
dedicated workstation. Calcium scoring of the coronary arteries was
performed using the Agatston method.

[Series 2: casc 3.0 bv41 2 bestdiast 73 % · axial · 0.46mm/px · z∈[+1281,+1389]mm · 4 of 61 slices shown, 5 images]
[im 13/61  vessel]
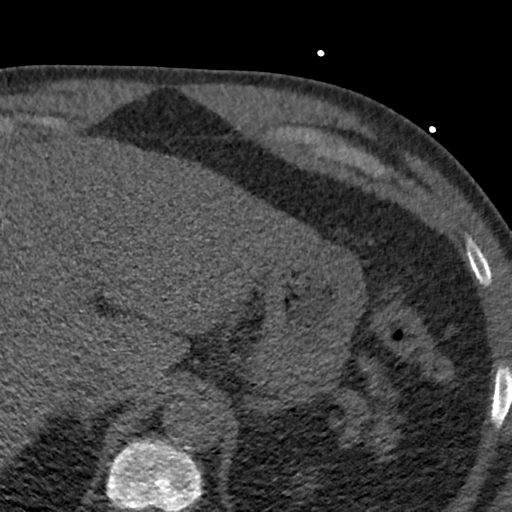
[im 13/61  lung]
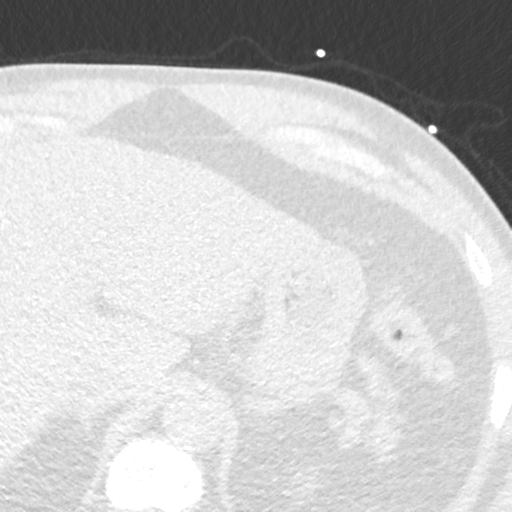
[im 25/61  vessel]
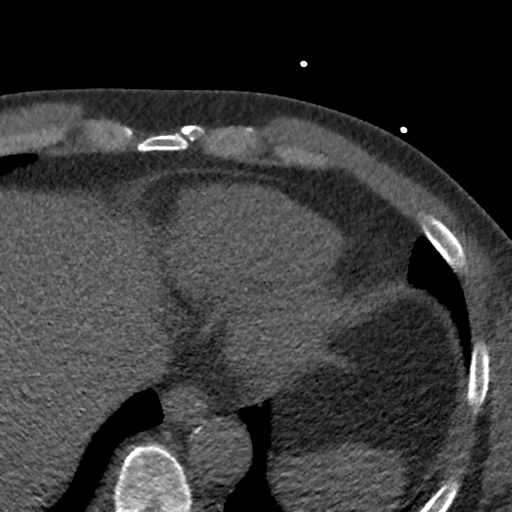
[im 37/61  vessel]
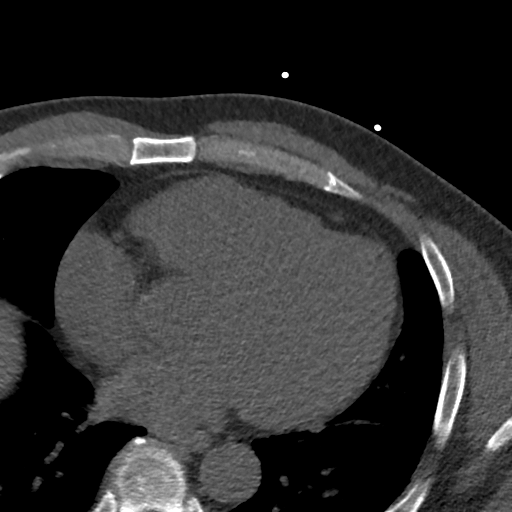
[im 49/61  vessel]
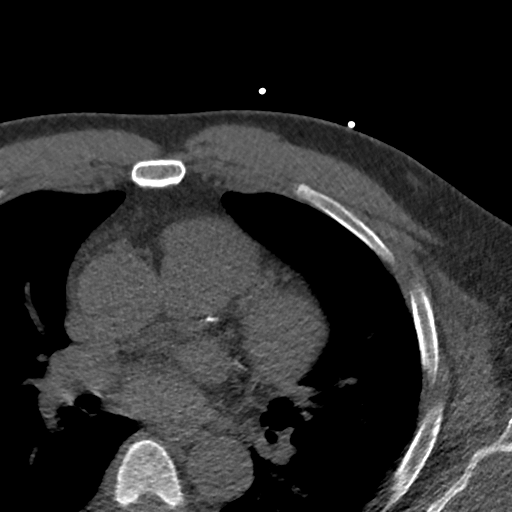

[Series 4: lung 73 % · axial · 0.70mm/px · z∈[+1275,+1395]mm · 5 of 61 slices shown]
[im 11/61  lung]
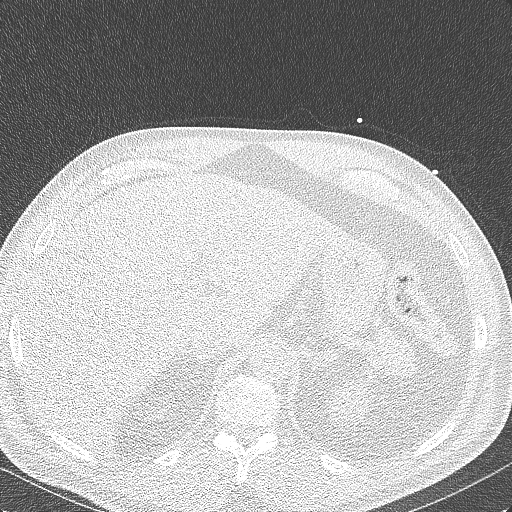
[im 21/61  lung]
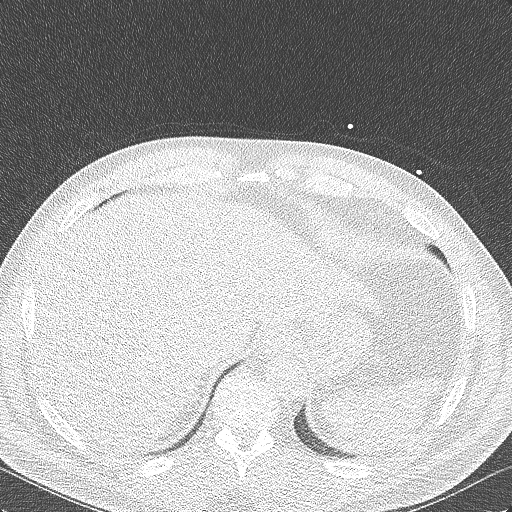
[im 31/61  lung]
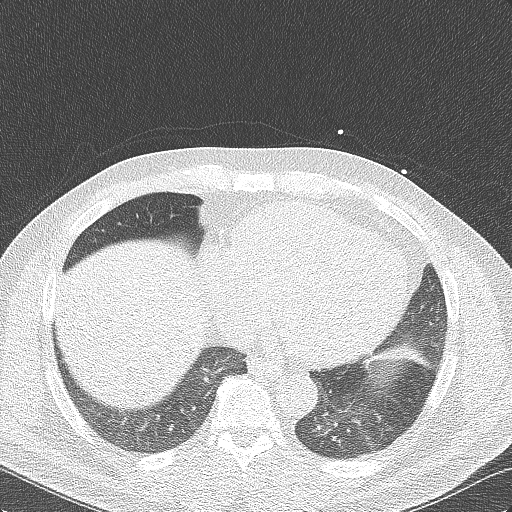
[im 41/61  lung]
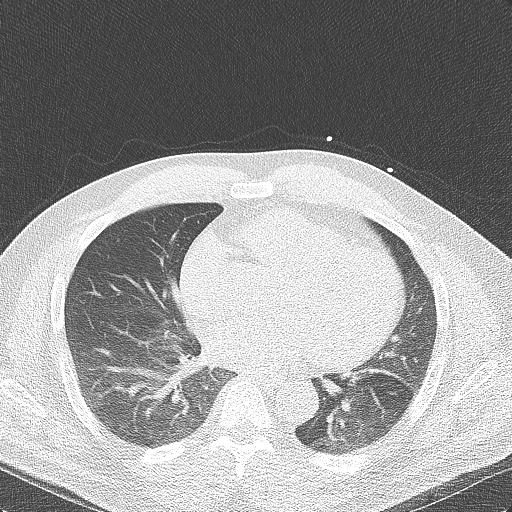
[im 51/61  lung]
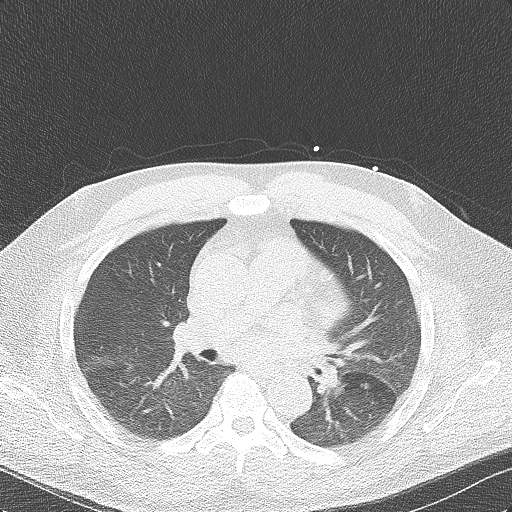

[Series 5: lung st 73 % · axial · 0.70mm/px · z∈[+1275,+1395]mm · 5 of 61 slices shown]
[im 11/61  lung]
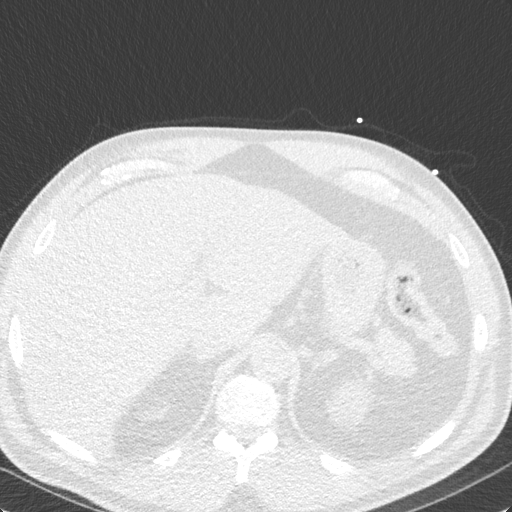
[im 21/61  lung]
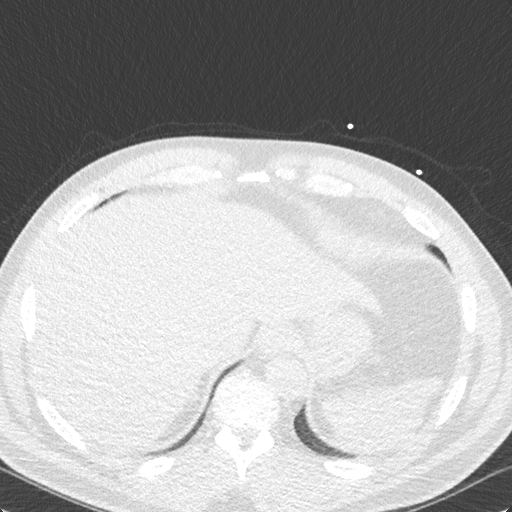
[im 31/61  lung]
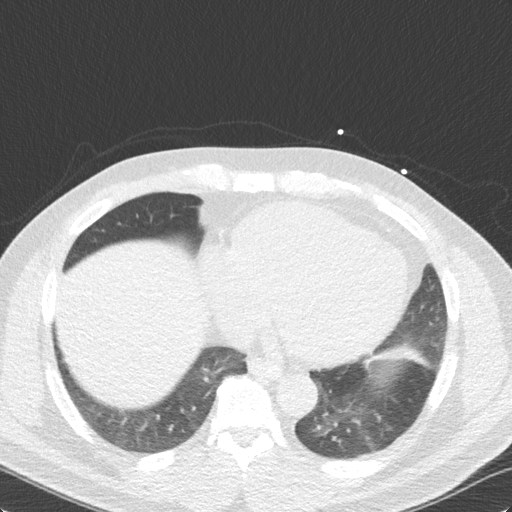
[im 41/61  lung]
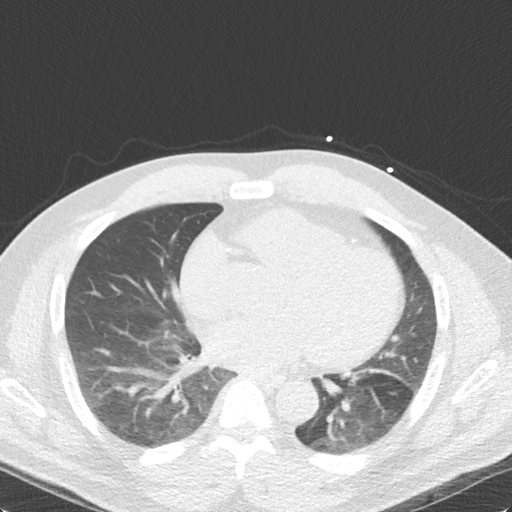
[im 51/61  lung]
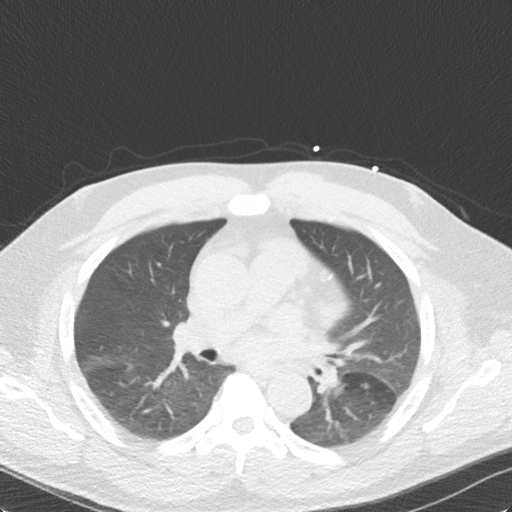

[14 of 20 positions shown; findings below may reference images not displayed]

FINDINGS: Atherosclerotic calcifications are noted in the thoracic aorta.
Within the visualized portions of the thorax there are no suspicious
appearing pulmonary nodules or masses, there is no acute
consolidative airspace disease, no pleural effusions, no
pneumothorax and no lymphadenopathy. Visualized portions of the
upper abdomen are unremarkable. There are no aggressive appearing
lytic or blastic lesions noted in the visualized portions of the
skeleton.
IMPRESSION: 1.  Aortic Atherosclerosis (O2XHU-VAS.S).
FINDINGS: Coronary arteries: Normal origins.

Coronary Calcium Score:

Left main:

Left anterior descending artery: 178

Left circumflex artery:

Right coronary artery: 148

Total: 393

Percentile: 99th

Pericardium: Normal.

Ascending Aorta: Normal caliber.

Non-cardiac: See separate report from [REDACTED].
IMPRESSION: Coronary calcium score of 393. This was 99th percentile for age-,
race-, and sex-matched controls.



If CAC=0, it is reasonable to withhold statin therapy and reassess
in 5 to 10 years, as long as higher risk conditions are absent
(diabetes mellitus, family history of premature CHD in first degree
relatives (males <55 years; females <65 years), cigarette smoking,
or LDL >=190 mg/dL).

If CAC is 1 to 99, it is reasonable to initiate statin therapy for
patients >=55 years of age.

If CAC is >=100 or >=75th percentile, it is reasonable to initiate
statin therapy at any age.

Cardiology referral should be considered for patients with CAC
scores >=400 or >=75th percentile.

*8428 AHA/ACC/AACVPR/AAPA/ABC/ELJUN/MIRHAD KELLE/ZHIRONG/Shanda/RABATA/ARMANDA/LASVECARS MODIDIMA
Guideline on the Management of Blood Cholesterol: A Report of the
American College of Cardiology/American Heart Association Task Force
on Clinical Practice Guidelines. J Am Coll Cardiol.
3340;73(24):3554-3111.

*** End of Addendum ***
EXAM:
OVER-READ INTERPRETATION  CT CHEST

The following report is an over-read performed by radiologist Dr.
Shamir Sakamoto [REDACTED] on 01/16/2021. This
over-read does not include interpretation of cardiac or coronary
anatomy or pathology. The coronary calcium score interpretation by
the cardiologist is attached.
FINDINGS: Atherosclerotic calcifications are noted in the thoracic aorta.
Within the visualized portions of the thorax there are no suspicious
appearing pulmonary nodules or masses, there is no acute
consolidative airspace disease, no pleural effusions, no
pneumothorax and no lymphadenopathy. Visualized portions of the
upper abdomen are unremarkable. There are no aggressive appearing
lytic or blastic lesions noted in the visualized portions of the
skeleton.
IMPRESSION: 1.  Aortic Atherosclerosis (O2XHU-VAS.S).

## 2022-06-07 NOTE — Progress Notes (Unsigned)
Cardiology Office Note:    Date:  06/11/2022   ID:  Jason [name], DOB 30-Apr-1971, MRN [id number]  PCP:  Seward Carol, MD   [facility] Providers Cardiologist:  Sinclair Grooms, MD     Referring MD: Seward Carol, MD   Chief Complaint: annual follow-up  History of Present Illness:    Jason [name] is a very pleasant  51 y.o. male with a hx of obesity, HLD, metabolic syndrome, HTN, elevated coronary calcium score, family history of CAD, sleep apnea  Diagnosed with mild to moderate obstructive sleep apnea March 2021.  Wanted to work on weight loss rather than wear CPAP. Coronary calcium score 393, 99th percentile for age/sex/race on 01/16/21. Previously intolerant to statins, was started on [facility] 04/2021.   He was last seen in cardiology clinic by Dr. Tamala Julian on 05/23/2021 at which time LDL was well-controlled on Repatha. He was encouraged to increase exercise and continue to work on weight loss and risk factor reduction. Follow-up recommended for 9-12 months.  Today, he is here alone for follow-up. Reports that he is feeling well. Discontinued HCTZ due to his wife told Dr. Tamala Julian that he has ankle swelling, however he reports this only occurs  when standing for hours during surgery. 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 States HCTZ also making gout worse.  Is working on weight loss, goal weight is 200 lb - was down to 212/214 then gained some back.  Reports snoring is better with lower weight and he uses a mouthguard. He denies chest pain, shortness of breath, fatigue, palpitations, melena, hematuria, hemoptysis, diaphoresis, weakness, presyncope, syncope, orthopnea, and PND. We discussed barriers to weight loss which he admits to frequent snacking after work. Does not get time to exercise on days that he works.   Past Medical History:  Diagnosis Date   Arthralgia    CONCERNS OF GOUT   Elevated blood uric acid level    Hemorrhoids    Hypercholesteremia     Hypertension    Monoarticular arthritis    Overweight    Protein in urine    AGE 16   Pure hypercholesterolemia     History reviewed. No pertinent surgical history.  Current Medications: Current Meds  Medication Sig   allopurinol (ZYLOPRIM) 100 MG tablet Take 200 mg by mouth daily.   amLODipine-olmesartan (AZOR) 5-20 MG tablet Take 1 tablet by mouth daily.   Colchicine 0.6 MG CAPS Take 1 tablet by mouth as needed (Gout).   Diclofenac Sodium 3 % GEL 1 application by Other route 2 (two) times daily as needed.   Krill Oil 300 MG CAPS Take 1 each by mouth.   Omega-3 Fatty Acids (FISH OIL) 1000 MG CAPS Take 1,500 mg by mouth.   Plant Sterols and Stanols (CHOLESTOFF PLUS) 450 MG CAPS Take 2 tablets by mouth.   REPATHA SURECLICK 253 MG/ML SOAJ Inject 1 mL (140 mg total) into the skin every 14 days.     Allergies:   Rosuvastatin calcium and Simvastatin   Social History   Socioeconomic History   Marital status: Married    Spouse name: Not on file   Number of children: Not on file   Years of education: Not on file   Highest education level: Not on file  Occupational History   Not on file  Tobacco Use   Smoking status: Never   Smokeless tobacco: Never  Substance and Sexual Activity   Alcohol use: Yes    Comment: SOCIALLY  Drug use: Never   Sexual activity: Not on file    Comment: MARRIED  Other Topics Concern   Not on file  Social History Narrative   Not on file   Social Determinants of Health   Financial Resource Strain: Not on file  Food Insecurity: Not on file  Transportation Needs: Not on file  Physical Activity: Not on file  Stress: Not on file  Social Connections: Not on file     Family History: The patient's family history includes CAD in his father; Healthy in his brother, daughter, sister, and son.  ROS:   Please see the history of present illness.  All other systems reviewed and are negative.  Labs/Other Studies Reviewed:    The following studies  were reviewed today:  CT Cardiac Score 01/16/21  Coronary arteries: Normal origins. Coronary Calcium Score: Left main: 26.4 Left anterior descending artery: 178 Left circumflex artery: 40.5 Right coronary artery: 148 Total: 393   Percentile: 99th Pericardium: Normal. Ascending Aorta: Normal caliber. Non-cardiac: See separate report from [facility].   IMPRESSION: Coronary calcium score of 393. This was 99th percentile for age-, race-, and sex-matched controls.   Recent Labs: No results found for requested labs within last 365 days.  Recent Lipid Panel    Component Value Date/Time   CHOL 103 03/11/2021 0931   TRIG 223 (H) 03/11/2021 0931   HDL 48 03/11/2021 0931   CHOLHDL 2.1 03/11/2021 0931   LDLCALC 21 03/11/2021 0931     Risk Assessment/Calculations:      Physical Exam:    VS:  BP 128/86 (BP Location: Left Arm, Patient Position: Sitting, Cuff Size: Normal)   Pulse 87   Ht 5\' 9"  (1.753 m)   Wt 225 lb 12.8 oz (102.4 kg)   SpO2 94%   BMI 33.34 kg/m     Wt Readings from Last 3 Encounters:  06/11/22 225 lb 12.8 oz (102.4 kg)  05/23/21 218 lb 9.6 oz (99.2 kg)  12/13/20 219 lb (99.3 kg)     GEN:  Well developed, obese male in no acute distress HEENT: Normal NECK: No JVD; No carotid bruits CARDIAC: RRR, no murmurs, rubs, gallops RESPIRATORY:  Clear to auscultation without rales, wheezing or rhonchi  ABDOMEN: Soft, non-tender, non-distended MUSCULOSKELETAL:  No edema; No deformity. 2+ pedal pulses, equal bilaterally SKIN: Warm and dry NEUROLOGIC:  Alert and oriented x 3 PSYCHIATRIC:  Normal affect   EKG:  EKG is ordered today.  The ekg ordered today demonstrates NSR at 87 bpm, no ST abnormality   Diagnoses:    1. Coronary artery disease involving native coronary artery of native heart without angina pectoris   2. Hyperlipidemia with target LDL less than 70   3. Primary hypertension   4. OSA (obstructive sleep apnea)   5. Obesity (BMI 30-39.9)     Assessment and Plan:     CAD without angina: Elevated coronary calcium score of 393 on 01/16/2021. He denies chest pain, dyspnea, or other symptoms concerning for angina.  No indication for further ischemic evaluation at this time.  Encouraged risk reduction including 150 minutes moderate intensity exercise each week, weight loss, mostly plant-based diet.  Hyperlipidemia LDL goal < 70: LDL 21 on 03/11/21, increased to 110 on 10/16/21.  Has continued Repatha. Is unsure as to why there would be a significant change as he has not missed any doses.  We will recheck today.  Hypertension: BP is well-controlled.  He does not monitor on a consistent basis.  Encouraged  him to take routine BP readings and follow-up if elevated.   OSA: Reports snoring has improved with weight loss, continues to work on weight loss.  Is using a mouthguard as well.   Obesity: BMI 33. Encouraged weight loss, 150 minutes of moderate intensity exercise each week, more plant-based diet. Admits he needs to exercise on a more consistent basis. Trying to following mediterranean style diet.   Disposition: 1 year with MD (Dr. Katrinka Blazing patient will need referral due to retirement)  Medication Adjustments/Labs and Tests Ordered: Current medicines are reviewed at length with the patient today.  Concerns regarding medicines are outlined above.  Orders Placed This Encounter  Procedures   Lipid Profile   EKG 12-Lead   No orders of the defined types were placed in this encounter.   Patient Instructions  Medication Instructions:   Your physician recommends that you continue on your current medications as directed. Please refer to the Current Medication list given to you today.   *If you need a refill on your cardiac medications before your next appointment, please call your pharmacy*   Lab Work:  TODAY!!!! LIPID  If you have labs (blood work) drawn today and your tests are completely normal, you will receive your results only  by: MyChart Message (if you have MyChart) OR A paper copy in the mail If you have any lab test that is abnormal or we need to change your treatment, we will call you to review the results.   Testing/Procedures:  None ordered.   Follow-Up: At [facility], you and your health needs are our priority.  As part of our continuing mission to provide you with exceptional heart care, we have created designated Provider Care Teams.  These Care Teams include your primary Cardiologist (physician) and Advanced Practice Providers (APPs -  Physician Assistants and Nurse Practitioners) who all work together to provide you with the care you need, when you need it.  We recommend signing up for the patient portal called "MyChart".  Sign up information is provided on this After Visit Summary.  MyChart is used to connect with patients for Virtual Visits (Telemedicine).  Patients are able to view lab/test results, encounter notes, upcoming appointments, etc.  Non-urgent messages can be sent to your provider as well.   To learn more about what you can do with MyChart, go to [email or web address].    Your next appointment:   1 year(s)  The format for your next appointment:   In Person  Provider:   Lesleigh Noe, MD     Other Instructions  Your physician wants you to follow-up in: 1 year with Dr. Katrinka Blazing.  You will receive a reminder letter in the mail two months in advance. If you don't receive a letter, please call our office to schedule the follow-up appointment.   Important Information About Sugar         Signed, Levi Aland, NP  06/11/2022 9:07 AM    [facility]

## 2022-06-11 ENCOUNTER — Ambulatory Visit: Payer: PRIVATE HEALTH INSURANCE | Attending: Nurse Practitioner | Admitting: Nurse Practitioner

## 2022-06-11 ENCOUNTER — Encounter: Payer: Self-pay | Admitting: Nurse Practitioner

## 2022-06-11 VITALS — BP 128/86 | HR 87 | Ht 69.0 in | Wt 225.8 lb

## 2022-06-11 DIAGNOSIS — I2510 Atherosclerotic heart disease of native coronary artery without angina pectoris: Secondary | ICD-10-CM

## 2022-06-11 DIAGNOSIS — E785 Hyperlipidemia, unspecified: Secondary | ICD-10-CM

## 2022-06-11 DIAGNOSIS — G4733 Obstructive sleep apnea (adult) (pediatric): Secondary | ICD-10-CM | POA: Diagnosis not present

## 2022-06-11 DIAGNOSIS — I10 Essential (primary) hypertension: Secondary | ICD-10-CM

## 2022-06-11 DIAGNOSIS — E669 Obesity, unspecified: Secondary | ICD-10-CM

## 2022-06-11 LAB — LIPID PANEL
Chol/HDL Ratio: 4 ratio (ref 0.0–5.0)
Cholesterol, Total: 215 mg/dL — ABNORMAL HIGH (ref 100–199)
HDL: 54 mg/dL (ref >39)
LDL Chol Calc (NIH): 127 mg/dL — ABNORMAL HIGH (ref 0–99)
Triglycerides: 190 mg/dL — ABNORMAL HIGH (ref 0–149)
VLDL Cholesterol Cal: 34 mg/dL (ref 5–40)

## 2022-06-11 NOTE — Patient Instructions (Signed)
Medication Instructions:   Your physician recommends that you continue on your current medications as directed. Please refer to the Current Medication list given to you today.   *If you need a refill on your cardiac medications before your next appointment, please call your pharmacy*   Lab Work:  TODAY!!!! LIPID  If you have labs (blood work) drawn today and your tests are completely normal, you will receive your results only by: [place] (if you have MyChart) OR A paper copy in the mail If you have any lab test that is abnormal or we need to change your treatment, we will call you to review the results.   Testing/Procedures:  None ordered.   Follow-Up: At [facility], you and your health needs are our priority.  As part of our continuing mission to provide you with exceptional heart care, we have created designated Provider Care Teams.  These Care Teams include your primary Cardiologist (physician) and Advanced Practice Providers (APPs -  Physician Assistants and Nurse Practitioners) who all work together to provide you with the care you need, when you need it.  We recommend signing up for the patient portal called "MyChart".  Sign up information is provided on this After Visit Summary.  MyChart is used to connect with patients for Virtual Visits (Telemedicine).  Patients are able to view lab/test results, encounter notes, upcoming appointments, etc.  Non-urgent messages can be sent to your provider as well.   To learn more about what you can do with MyChart, go to [email or web address].    Your next appointment:   1 year(s)  The format for your next appointment:   In Person  Provider:   Sinclair Grooms, MD     Other Instructions  Your physician wants you to follow-up in: 1 year with Dr. Tamala Julian.  You will receive a reminder letter in the mail two months in advance. If you don't receive a letter, please call our office to schedule the follow-up  appointment.   Important Information About Sugar

## 2022-10-27 DIAGNOSIS — N1831 Chronic kidney disease, stage 3a: Secondary | ICD-10-CM | POA: Diagnosis not present

## 2022-11-02 ENCOUNTER — Telehealth: Payer: Self-pay

## 2022-11-02 MED ORDER — REPATHA SURECLICK 140 MG/ML ~~LOC~~ SOAJ: SUBCUTANEOUS | 11 refills | Status: DC

## 2022-11-02 NOTE — Telephone Encounter (Signed)
Routed note was blank without any info. Assuming this is for Repatha refill?

## 2022-11-03 NOTE — Telephone Encounter (Signed)
Refill sent in

## 2022-11-05 ENCOUNTER — Other Ambulatory Visit: Payer: Self-pay

## 2022-11-05 ENCOUNTER — Encounter: Payer: Self-pay | Admitting: Pharmacist

## 2022-11-05 MED ORDER — REPATHA SURECLICK 140 MG/ML ~~LOC~~ SOAJ: SUBCUTANEOUS | 11 refills | Status: DC

## 2022-11-05 NOTE — Telephone Encounter (Signed)
Pt calling requesting that his medication repatha be sent to [place] with discount card. Pt would like a call back concerning this matter. Please address

## 2022-11-05 NOTE — Telephone Encounter (Signed)
*  STAT* If patient is at the pharmacy, call can be transferred to refill team.   1. Which medications need to be refilled? (please list name of each medication and dose if known)  Evolocumab (REPATHA SURECLICK) XX123456 MG/ML SOAJ  2. Which pharmacy/location (including street and city if local pharmacy) is medication to be sent to? [place], [place]  3. Do they need a 30 day or 90 day supply?   Patient is following up regarding Repatha refill. He states it was sent to the wrong pharmacy. He is requesting to have Rx transferred to the pharmacy listed above, along with a discount coupon. Patient would like a call back to confirm when it has been sent.

## 2022-11-05 NOTE — Telephone Encounter (Signed)
Rx sent. Included copay card info. Also sent info in [place] message. Called pt and made him aware.

## 2022-11-06 ENCOUNTER — Telehealth: Payer: Self-pay | Admitting: Interventional Cardiology

## 2022-11-06 NOTE — Telephone Encounter (Signed)
Pt c/o medication issue:  1. Name of Medication: Evolocumab (REPATHA SURECLICK) XX123456 MG/ML SOAJ  2. How are you currently taking this medication (dosage and times per day)?   3. Are you having a reaction (difficulty breathing--STAT)? No  4. What is your medication issue? Patient states he's still waiting on the PA waiting to be done.

## 2022-11-10 ENCOUNTER — Other Ambulatory Visit (HOSPITAL_COMMUNITY): Payer: Self-pay

## 2022-11-10 ENCOUNTER — Telehealth: Payer: Self-pay

## 2022-11-10 NOTE — Telephone Encounter (Signed)
Pharmacy Patient Advocate Encounter  Prior Authorization for REPATHA 140 MG/ML INJ  has been approved.    Effective dates: 11/10/22 through 11/10/23  Karie Soda, [place] Patient Advocate Specialist Direct Number: [id number] Fax: [phone]

## 2022-11-10 NOTE — Telephone Encounter (Signed)
Pharmacy Patient Advocate Encounter  *Patient was unable to be verified in [facility] because his DOB in EPIC does not match what [employer] has on file.     Submitted using the DOB [employer] has on file (1971/08/19) and was able to verify patient. Patient needs to let us know if we have the incorrect DOB (1970-12-21) or update his DOB with insurance to avoid delay or denial on medications in the future.   Received notification from [facility] that prior authorization for REPATHA 140 MG/ML INJ is needed.    PA submitted on 11/10/22 [place] [id number] Status is pending  Karie Soda, CPhT Pharmacy Patient Advocate Specialist Direct Number: [phone] Fax: [phone]

## 2022-11-11 NOTE — Telephone Encounter (Signed)
Issue with DOB. See mychart message for more info

## 2022-11-13 DIAGNOSIS — R7989 Other specified abnormal findings of blood chemistry: Secondary | ICD-10-CM | POA: Diagnosis not present

## 2022-11-16 DIAGNOSIS — M1A9XX1 Chronic gout, unspecified, with tophus (tophi): Secondary | ICD-10-CM | POA: Diagnosis not present

## 2022-11-16 DIAGNOSIS — M79641 Pain in right hand: Secondary | ICD-10-CM | POA: Diagnosis not present

## 2022-11-16 DIAGNOSIS — M79642 Pain in left hand: Secondary | ICD-10-CM | POA: Diagnosis not present

## 2022-11-16 DIAGNOSIS — Z79899 Other long term (current) drug therapy: Secondary | ICD-10-CM | POA: Diagnosis not present

## 2022-11-20 DIAGNOSIS — R7989 Other specified abnormal findings of blood chemistry: Secondary | ICD-10-CM | POA: Diagnosis not present

## 2022-12-17 DIAGNOSIS — I129 Hypertensive chronic kidney disease with stage 1 through stage 4 chronic kidney disease, or unspecified chronic kidney disease: Secondary | ICD-10-CM | POA: Diagnosis not present

## 2022-12-17 DIAGNOSIS — N1832 Chronic kidney disease, stage 3b: Secondary | ICD-10-CM | POA: Diagnosis not present

## 2022-12-17 DIAGNOSIS — R809 Proteinuria, unspecified: Secondary | ICD-10-CM | POA: Diagnosis not present

## 2022-12-28 DIAGNOSIS — R809 Proteinuria, unspecified: Secondary | ICD-10-CM | POA: Diagnosis not present

## 2022-12-28 DIAGNOSIS — N1832 Chronic kidney disease, stage 3b: Secondary | ICD-10-CM | POA: Diagnosis not present

## 2022-12-28 DIAGNOSIS — I129 Hypertensive chronic kidney disease with stage 1 through stage 4 chronic kidney disease, or unspecified chronic kidney disease: Secondary | ICD-10-CM | POA: Diagnosis not present

## 2023-01-05 DIAGNOSIS — L821 Other seborrheic keratosis: Secondary | ICD-10-CM | POA: Diagnosis not present

## 2023-01-05 DIAGNOSIS — L918 Other hypertrophic disorders of the skin: Secondary | ICD-10-CM | POA: Diagnosis not present

## 2023-01-05 DIAGNOSIS — D239 Other benign neoplasm of skin, unspecified: Secondary | ICD-10-CM | POA: Diagnosis not present

## 2023-01-15 ENCOUNTER — Other Ambulatory Visit (HOSPITAL_COMMUNITY): Payer: Self-pay | Admitting: Nephrology

## 2023-01-15 DIAGNOSIS — N1832 Chronic kidney disease, stage 3b: Secondary | ICD-10-CM

## 2023-01-15 DIAGNOSIS — R809 Proteinuria, unspecified: Secondary | ICD-10-CM

## 2023-01-27 DIAGNOSIS — I129 Hypertensive chronic kidney disease with stage 1 through stage 4 chronic kidney disease, or unspecified chronic kidney disease: Secondary | ICD-10-CM | POA: Diagnosis not present

## 2023-01-27 DIAGNOSIS — R809 Proteinuria, unspecified: Secondary | ICD-10-CM | POA: Diagnosis not present

## 2023-01-27 DIAGNOSIS — M1000 Idiopathic gout, unspecified site: Secondary | ICD-10-CM | POA: Diagnosis not present

## 2023-01-27 DIAGNOSIS — N1832 Chronic kidney disease, stage 3b: Secondary | ICD-10-CM | POA: Diagnosis not present

## 2023-03-26 DIAGNOSIS — I129 Hypertensive chronic kidney disease with stage 1 through stage 4 chronic kidney disease, or unspecified chronic kidney disease: Secondary | ICD-10-CM | POA: Diagnosis not present

## 2023-03-26 DIAGNOSIS — N1832 Chronic kidney disease, stage 3b: Secondary | ICD-10-CM | POA: Diagnosis not present

## 2023-03-26 DIAGNOSIS — R809 Proteinuria, unspecified: Secondary | ICD-10-CM | POA: Diagnosis not present

## 2023-03-27 LAB — LAB REPORT - SCANNED
Albumin, Urine POC: 394.7
Creatinine, POC: 90.8 mg/dL
EGFR: 40
Microalb Creat Ratio: 435

## 2023-03-31 DIAGNOSIS — H40003 Preglaucoma, unspecified, bilateral: Secondary | ICD-10-CM | POA: Diagnosis not present

## 2023-03-31 DIAGNOSIS — H35713 Central serous chorioretinopathy, bilateral: Secondary | ICD-10-CM | POA: Diagnosis not present

## 2023-04-19 ENCOUNTER — Other Ambulatory Visit (HOSPITAL_COMMUNITY): Payer: Self-pay

## 2023-05-04 DIAGNOSIS — D485 Neoplasm of uncertain behavior of skin: Secondary | ICD-10-CM | POA: Diagnosis not present

## 2023-05-04 DIAGNOSIS — D181 Lymphangioma, any site: Secondary | ICD-10-CM | POA: Diagnosis not present

## 2023-05-19 DIAGNOSIS — Z79899 Other long term (current) drug therapy: Secondary | ICD-10-CM | POA: Diagnosis not present

## 2023-05-19 DIAGNOSIS — M1A9XX1 Chronic gout, unspecified, with tophus (tophi): Secondary | ICD-10-CM | POA: Diagnosis not present

## 2023-05-19 DIAGNOSIS — M79642 Pain in left hand: Secondary | ICD-10-CM | POA: Diagnosis not present

## 2023-05-19 DIAGNOSIS — M79641 Pain in right hand: Secondary | ICD-10-CM | POA: Diagnosis not present

## 2023-06-14 ENCOUNTER — Ambulatory Visit: Payer: PRIVATE HEALTH INSURANCE | Admitting: Interventional Cardiology

## 2023-07-07 ENCOUNTER — Ambulatory Visit: Payer: BC Managed Care – PPO | Attending: Interventional Cardiology | Admitting: Cardiology

## 2023-07-07 ENCOUNTER — Encounter: Payer: Self-pay | Admitting: Cardiology

## 2023-07-07 VITALS — BP 110/78 | HR 77 | Ht 69.0 in | Wt 225.4 lb

## 2023-07-07 DIAGNOSIS — E785 Hyperlipidemia, unspecified: Secondary | ICD-10-CM

## 2023-07-07 DIAGNOSIS — I10 Essential (primary) hypertension: Secondary | ICD-10-CM | POA: Diagnosis not present

## 2023-07-07 DIAGNOSIS — E7849 Other hyperlipidemia: Secondary | ICD-10-CM

## 2023-07-07 DIAGNOSIS — G4733 Obstructive sleep apnea (adult) (pediatric): Secondary | ICD-10-CM

## 2023-07-07 DIAGNOSIS — R7303 Prediabetes: Secondary | ICD-10-CM | POA: Diagnosis not present

## 2023-07-07 NOTE — Progress Notes (Signed)
Cardiology Office Note:    Date:  07/07/2023   ID:  Jason [name], DOB 08-Nov-1970, MRN [id number]  PCP:  Renford Dills, MD  Cardiologist:  Thomasene Ripple, DO  Electrophysiologist:  None   Referring MD: Renford Dills, MD   " I am ok"  History of Present Illness:    Jason [name] is a 52 y.o. male with a hx of obesity, familial hyperlipidemia, metabolic syndrome, hypertension, coronary calcification seen on CT calcium scoring, sleep apnea prefers to lose weight rather than use his CPAP.  Patient previously followed with Dr. Katrinka Blazing.  He is here today to transition cardiovascular care.  This is my first visit with the patient.   He has been on Repatha due to intolerance to statins, which caused muscle aches. The patient has a family history of heart disease, with his father having had a bypass graft and later passing away from a heart attack. The patient suspects he has familial hypercholesterolemia as multiple family members have had cholesterol issues. He is also prediabetic and has been started on Farxiga by his nephrologist due to declining kidney function. The patient expresses interest in genetic testing to better understand his potential familial hypercholesterolemia.   Past Medical History:  Diagnosis Date   Arthralgia    CONCERNS OF GOUT   Elevated blood uric acid level    Hemorrhoids    Hypercholesteremia    Hypertension    Monoarticular arthritis    Overweight    Protein in urine    AGE 58   Pure hypercholesterolemia     No past surgical history on file.  Current Medications: Current Meds  Medication Sig   allopurinol (ZYLOPRIM) 300 MG tablet Take 300 mg by mouth daily.   amLODipine-olmesartan (AZOR) 5-20 MG tablet Take 1 tablet by mouth daily.   Colchicine 0.6 MG CAPS Take 1 tablet by mouth as needed (Gout).   dapagliflozin propanediol (FARXIGA) 10 MG TABS tablet Take 10 mg by mouth daily.   Diclofenac Sodium 3 % GEL 1 application by Other route 2 (two) times  daily as needed.   Evolocumab (REPATHA SURECLICK) 140 MG/ML SOAJ Inject 1 mL (140 mg total) into the skin every 14 days.   Krill Oil 300 MG CAPS Take 1 each by mouth.   Omega-3 Fatty Acids (FISH OIL) 1000 MG CAPS Take 1,500 mg by mouth.   Plant Sterols and Stanols (CHOLESTOFF PLUS) 450 MG CAPS Take 2 tablets by mouth.     Allergies:   Rosuvastatin calcium and Simvastatin   Social History   Socioeconomic History   Marital status: Married    Spouse name: Not on file   Number of children: Not on file   Years of education: Not on file   Highest education level: Not on file  Occupational History   Not on file  Tobacco Use   Smoking status: Never   Smokeless tobacco: Never  Substance and Sexual Activity   Alcohol use: Yes    Comment: SOCIALLY   Drug use: Never   Sexual activity: Not on file    Comment: MARRIED  Other Topics Concern   Not on file  Social History Narrative   Not on file   Social Determinants of Health   Financial Resource Strain: Not on file  Food Insecurity: Not on file  Transportation Needs: Not on file  Physical Activity: Not on file  Stress: Not on file  Social Connections: Not on file     Family History: The patient's family history includes  CAD in his father; Healthy in his brother, daughter, sister, and son.  ROS:   Review of Systems  Constitution: Negative for decreased appetite, fever and weight gain.  HENT: Negative for congestion, ear discharge, hoarse voice and sore throat.   Eyes: Negative for discharge, redness, vision loss in right eye and visual halos.  Cardiovascular: Negative for chest pain, dyspnea on exertion, leg swelling, orthopnea and palpitations.  Respiratory: Negative for cough, hemoptysis, shortness of breath and snoring.   Endocrine: Negative for heat intolerance and polyphagia.  Hematologic/Lymphatic: Negative for bleeding problem. Does not bruise/bleed easily.  Skin: Negative for flushing, nail changes, rash and suspicious  lesions.  Musculoskeletal: Negative for arthritis, joint pain, muscle cramps, myalgias, neck pain and stiffness.  Gastrointestinal: Negative for abdominal pain, bowel incontinence, diarrhea and excessive appetite.  Genitourinary: Negative for decreased libido, genital sores and incomplete emptying.  Neurological: Negative for brief paralysis, focal weakness, headaches and loss of balance.  Psychiatric/Behavioral: Negative for altered mental status, depression and suicidal ideas.  Allergic/Immunologic: Negative for HIV exposure and persistent infections.    EKGs/Labs/Other Studies Reviewed:    The following studies were reviewed today:   EKG:  The ekg ordered today demonstrates sinus rhythm, heart rate 77 bpm.  Recent Labs: No results found for requested labs within last 365 days.  Recent Lipid Panel    Component Value Date/Time   CHOL 215 (H) 06/11/2022 0853   TRIG 190 (H) 06/11/2022 0853   HDL 54 06/11/2022 0853   CHOLHDL 4.0 06/11/2022 0853   LDLCALC 127 (H) 06/11/2022 0853    Physical Exam:    VS:  BP 110/78 (BP Location: Right Arm, Patient Position: Sitting, Cuff Size: Normal)   Pulse 77   Ht 5\' 9"  (1.753 m)   Wt 225 lb 6.4 oz (102.2 kg)   SpO2 98%   BMI 33.29 kg/m     Wt Readings from Last 3 Encounters:  07/07/23 225 lb 6.4 oz (102.2 kg)  06/11/22 225 lb 12.8 oz (102.4 kg)  05/23/21 218 lb 9.6 oz (99.2 kg)     GEN: Well nourished, well developed in no acute distress HEENT: Normal NECK: No JVD; No carotid bruits LYMPHATICS: No lymphadenopathy CARDIAC: S1S2 noted,RRR, no murmurs, rubs, gallops RESPIRATORY:  Clear to auscultation without rales, wheezing or rhonchi  ABDOMEN: Soft, non-tender, non-distended, +bowel sounds, no guarding. EXTREMITIES: No edema, No cyanosis, no clubbing MUSCULOSKELETAL:  No deformity  SKIN: Warm and dry NEUROLOGIC:  Alert and oriented x 3, non-focal PSYCHIATRIC:  Normal affect, good insight  ASSESSMENT:    1. Primary  hypertension   2. Hyperlipidemia with target LDL less than 70   3. Prediabetes   4. Familial hyperlipidemia   5. Morbid obesity (HCC)   6. OSA (obstructive sleep apnea)    PLAN:    Hyperlipidemia Elevated LDL (99) and triglycerides (312) from February 2024. Patient on Repatha due to statin intolerance. Possible familial hypercholesterolemia. -Order repeat lipid panel and hemoglobin A1c. -Consider addition lipid-lowering as well as triglyceride lowering medicine if lipid panel remains suboptimal. -Refer to geneticist for evaluation of possible familial hypercholesterolemia.  Prediabetes Noted from previous labs.   Chronic kidney disease follows with nephrology now.  Patient now on Farxiga 10mg  daily. -Continue Farxiga 10mg  daily.   Research Participation Discussed participation in African American Heart Study. -Coordinate with research coordinator to provide patient with additional information and consent process.  General Health Maintenance -Annual follow-up appointment scheduled.  The patient is in agreement with the above plan. The  patient left the office in stable condition.  The patient will follow up in   Medication Adjustments/Labs and Tests Ordered: Current medicines are reviewed at length with the patient today.  Concerns regarding medicines are outlined above.  Orders Placed This Encounter  Procedures   Lipid panel   Hemoglobin A1c   Ambulatory referral to Genetics   EKG 12-Lead   No orders of the defined types were placed in this encounter.   Patient Instructions  Medication Instructions:  Your physician recommends that you continue on your current medications as directed. Please refer to the Current Medication list given to you today.  *If you need a refill on your cardiac medications before your next appointment, please call your pharmacy*   Lab Work: Lipids, HgbA1c If you have labs (blood work) drawn today and your tests are completely normal, you will  receive your results only by: MyChart Message (if you have MyChart) OR A paper copy in the mail If you have any lab test that is abnormal or we need to change your treatment, we will call you to review the results.   Testing/Procedures: None   Follow-Up: At [facility], you and your health needs are our priority.  As part of our continuing mission to provide you with exceptional heart care, we have created designated Provider Care Teams.  These Care Teams include your primary Cardiologist (physician) and Advanced Practice Providers (APPs -  Physician Assistants and Nurse Practitioners) who all work together to provide you with the care you need, when you need it.  Your next appointment:   1 year(s)  Provider:   Thomasene Ripple, DO    Other instructions: Referral made to  Dr. [place] Haldeman-Englert: Clinical geneticist  [facility] [address] [place], [place] [address] [phone]   Adopting a Healthy Lifestyle.  Know what a healthy weight is for you (roughly BMI <25) and aim to maintain this   Aim for 7+ servings of fruits and vegetables daily   65-80+ fluid ounces of water or unsweet tea for healthy kidneys   Limit to max 1 drink of alcohol per day; avoid smoking/tobacco   Limit animal fats in diet for cholesterol and heart health - choose grass fed whenever available   Avoid highly processed foods, and foods high in saturated/trans fats   Aim for low stress - take time to unwind and care for your mental health   Aim for 150 min of moderate intensity exercise weekly for heart health, and weights twice weekly for bone health   Aim for 7-9 hours of sleep daily   When it comes to diets, agreement about the perfect plan isnt easy to find, even among the experts. Experts at the [facility] developed an idea known as the Healthy Eating Plate. Just imagine a plate divided into logical, healthy portions.   The emphasis is  on diet quality:   Load up on vegetables and fruits - one-half of your plate: Aim for color and variety, and remember that potatoes dont count.   Go for whole grains - one-quarter of your plate: Whole wheat, barley, wheat berries, quinoa, oats, brown rice, and foods made with them. If you want pasta, go with whole wheat pasta.   Protein power - one-quarter of your plate: Fish, chicken, beans, and nuts are all healthy, versatile protein sources. Limit red meat.   The diet, however, does go beyond the plate, offering a few other suggestions.  Use healthy plant oils, such as olive, canola, soy, corn, sunflower and peanut. Check the labels, and avoid partially hydrogenated oil, which have unhealthy trans fats.   If youre thirsty, drink water. Coffee and tea are good in moderation, but skip sugary drinks and limit milk and dairy products to one or two daily servings.   The type of carbohydrate in the diet is more important than the amount. Some sources of carbohydrates, such as vegetables, fruits, whole grains, and beans-are healthier than others.   Finally, stay active  Signed, Thomasene Ripple, DO  07/07/2023 9:57 AM    [facility]

## 2023-07-07 NOTE — Patient Instructions (Addendum)
Medication Instructions:  Your physician recommends that you continue on your current medications as directed. Please refer to the Current Medication list given to you today.  *If you need a refill on your cardiac medications before your next appointment, please call your pharmacy*   Lab Work: Lipids, HgbA1c If you have labs (blood work) drawn today and your tests are completely normal, you will receive your results only by: MyChart Message (if you have MyChart) OR A paper copy in the mail If you have any lab test that is abnormal or we need to change your treatment, we will call you to review the results.   Testing/Procedures: None   Follow-Up: At [facility], you and your health needs are our priority.  As part of our continuing mission to provide you with exceptional heart care, we have created designated Provider Care Teams.  These Care Teams include your primary Cardiologist (physician) and Advanced Practice Providers (APPs -  Physician Assistants and Nurse Practitioners) who all work together to provide you with the care you need, when you need it.  Your next appointment:   1 year(s)  Provider:   Thomasene Ripple, DO    Other instructions: Referral made to  Dr. Italy Haldeman-Englert: Clinical geneticist  [facility] [address] [place], [place] [address] [phone]

## 2023-07-08 LAB — LIPID PANEL
Chol/HDL Ratio: 3 ratio (ref 0.0–5.0)
Cholesterol, Total: 174 mg/dL (ref 100–199)
HDL: 58 mg/dL (ref >39)
LDL Chol Calc (NIH): 83 mg/dL (ref 0–99)
Triglycerides: 197 mg/dL — ABNORMAL HIGH (ref 0–149)
VLDL Cholesterol Cal: 33 mg/dL (ref 5–40)

## 2023-07-08 LAB — HEMOGLOBIN A1C
Est. average glucose Bld gHb Est-mCnc: 126 mg/dL
Hgb A1c MFr Bld: 6 % — ABNORMAL HIGH (ref 4.8–5.6)

## 2023-07-20 ENCOUNTER — Ambulatory Visit (INDEPENDENT_AMBULATORY_CARE_PROVIDER_SITE_OTHER): Payer: BC Managed Care – PPO | Admitting: Medical Genetics

## 2023-07-20 ENCOUNTER — Encounter: Payer: Self-pay | Admitting: Medical Genetics

## 2023-07-20 VITALS — BP 138/85 | HR 91 | Ht 69.0 in | Wt 229.4 lb

## 2023-07-20 DIAGNOSIS — E663 Overweight: Secondary | ICD-10-CM | POA: Diagnosis not present

## 2023-07-20 DIAGNOSIS — E7800 Pure hypercholesterolemia, unspecified: Secondary | ICD-10-CM | POA: Diagnosis not present

## 2023-07-20 DIAGNOSIS — I10 Essential (primary) hypertension: Secondary | ICD-10-CM

## 2023-07-20 DIAGNOSIS — E7801 Familial hypercholesterolemia: Secondary | ICD-10-CM | POA: Diagnosis not present

## 2023-07-20 NOTE — Progress Notes (Unsigned)
MEDICAL GENETICS NEW PATIENT EVALUATION  Patient name: Jason [name] DOB: 1971-09-14 Age: 52 y.o. MRN: [id number]  Referring Provider/Specialty: Thomasene Ripple, MD; Renford Dills, MD Date of Evaluation: 07/20/2023 Chief Complaint/Reason for Referral: Personal and family history of hypercholesterolemia  Assessment: We discussed with [name] that it is possible he could have a rare genetic type of familial hypercholesterolemia (FH), although it is also possible his hypercholesterolemia is multifactorial in etiology (genetic susceptibility with environmental factors). Testing of 4 genes related to FH is available, and [name] was interested in this being performed. A sample was collected and sent to [facility], and results will be available in approximately 1 month. We will contact [name] when they return. He should otherwise continue follow up with his current providers per their recommendations.  Recommendations: Familial hypercholesterolemia testing through [facility] - results expected in 1 month. Will send information on the GeneConnect. Continue follow up with current medical providers per their recommendations. Activities as tolerated.  Follow up in genetics clinic will be based on the results of the testing.   HPI: Jason [name] is a 52 y.o. assigned male at birth who presents today for an initial genetics evaluation for a personal and family history of hypercholesterolemia. He provided the history. This information, along with a review of pertinent records, labs, and radiology studies, is summarized below.  [name] first noted his cholesterol to be high in his early 77s, but had not had it checked much prior to that. He has been followed regularly by a PCP and cardiology to manage his cholesterol levels. He has been on statins for a few years but did not tolerate those well due to muscle aches, but his cholesterol level was able to be lowered. He was switched to a PCSK9  inhibitor, which has helped reduce his cholesterol levels, but his triglyceride levels remain elevated. He denies any xanthomas or other cholesterol deposits.   [name] father was diagnosed with elevated cholesterol in his 80s, and later had coronary artery disease. His mother and all of his siblings also have high cholesterol. [name] was referred to genetics for his personal and family history of hypercholesterolemia.  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 He does not wear glasses. He does not have cholesterol deposits in his eyes. He developed gout in several joints in his hands, knees, and toes. His blood pressure has been elevated in the past but under better control now.   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 (Patient not taking: Reported on 07/07/2023)     allopurinol (ZYLOPRIM) 300 MG tablet Take 300 mg by mouth daily.     amLODipine-olmesartan (AZOR) 5-20 MG tablet Take 1 tablet by mouth daily.  Colchicine 0.6 MG CAPS Take 1 tablet by mouth as needed (Gout).     dapagliflozin propanediol (FARXIGA) 10 MG TABS tablet Take 10 mg by mouth daily.     Diclofenac Sodium 3 % GEL 1 application by Other route 2 (two) times daily as needed.      Evolocumab (REPATHA SURECLICK) 140 MG/ML SOAJ Inject 1 mL (140 mg total) into the skin every 14 days. 2 mL 11   Krill Oil 300 MG CAPS Take 1 each by mouth.     Omega-3 Fatty Acids (FISH OIL) 1000 MG CAPS Take 1,500 mg by mouth.     Plant Sterols and Stanols (CHOLESTOFF PLUS) 450 MG CAPS Take 2 tablets by mouth.     No current facility-administered medications on file prior to visit.   Allergies:  Allergies  Allergen Reactions   Rosuvastatin Calcium Other (See Comments)   Simvastatin Other (See Comments)   Immunizations: Up to date  Review of Systems: Negative except as noted in the HPI  Family History: Family History  Problem Relation Age of Onset   CAD Father        CABG   Healthy Sister    Healthy Brother    Healthy Daughter    Healthy Son   Self-reported ancestry: African-American, Native American Consanguinity: Denies Please see the Dentist note for additional information  Social History: Lives with his spouse in [place]  Vitals: Weight: 229.4 lbs Height: 5'9"  Genetics Physical Exam:  Constitution: The patient is active and alert  Head: No abnormalities detected in: head, hairline, shape or size    Anterior fontanelle flat: not flat    Anterior fontanelle open: not open    Bitemporal narrowing: forehead not narrow    Frontal bossing: no frontal bossing    Macrocephaly: not macrocephalic    Microcephaly: not microcephalic    Plagiocephaly: not plagiocephalic  Face: No abnormalities detected in: face, midface or shape    Coarse facial features: no coarse facies    Midfacial hypoplasia: no midfacial hypoplasia  Cardiac: No abnormalities detected in: cardiovascular system    Abnormal distal perfusion: normal distal perfusion    Irregular rate: heart rate regular    Irregular rhythm: regular rhythm    Murmur: no murmur  Lungs: No abnormalities detected in: pulmonary system, bilateral auscultation or effort  Abdomen: No abnormalities  detected in: abdomen or appearance    Abnormal umbilicus: normal umbilicus    Diastasis recti: no diastasis recti    Distended abdomen: no distension    Hepatosplenomegaly: no hepatosplenomegaly    Umbilical hernia: no umbilical hernia  Neurological: No abnormalities detected in: neurological system, deep tendon reflexes, antigravity movement of extremities, strength, facial movement or tone    Hypertonia: not hypertonic    Hypotonia: not hypotonic Hair, Nails, and Skin: No abnormalities detected in: integumentary system, hair, nails or skin    Abnormally healed scars: no abnormally healed scars    Birthmarks: no birthmarks    Lesions: no lesions (comments: No xanthomas)  Extremities: No abnormalities detected in: extremities    Asymmetric girth: symmetric girth    Contractures: no joint contractures    Limited range of motion: non-limited ROM   [place] Haldeman-Englert, MD [facility]etics Date: 07/20/2023 Time: 1000   Total time spent: 50 minutes Time spent includes face to face and non-face to face care for the patient on the date of this encounter (history and physical, genetic counseling, coordination of care, data gathering and/or documentation as outlined).

## 2023-07-20 NOTE — Patient Instructions (Signed)
Familial hypercholesterolemia testing through [facility] - results expected in 1 month. Will send information on the GeneConnect. Continue follow up with current medical providers per their recommendations. Activities as tolerated.   Follow up in genetics clinic will be based on the results of the testing.  Thank you for allowing [place] to be a part of your care. Please let us know if there is anything else you need from [place].  [facility] Team

## 2023-07-20 NOTE — Progress Notes (Unsigned)
    GENETIC COUNSELING NEW PATIENT EVALUATION Patient name: Naod Sweetland DOB: March 10, 1971 Age: 52 y.o. MRN: [id number]  Referring Provider/Specialty: Thomasene Ripple, MD; Renford Dills, MD Date of Evaluation: 07/20/2023 Chief Complaint/Reason for Referral: Personal and family history of hypercholesterolemia   Brief Summary: Dequane Strahan is a 52 y.o. male who presents today for an initial genetics evaluation for familial hypercholesterolemia. He is unaccompanied at today's visit.  Prior genetic testing has not been performed.   Family History: See pedigree obtained during today's visit under History->Family->Pedigree.  The family history was notable for the following:  Paternal Family History Father, deceased at 76 yo, with hypercholesterolemia (onset in his 96s or 22s), unknown cardiac valve replacement, and heart attack. 5 aunts and 4 uncles, all with hypercholesterolemia.  Maternal Family History ***  Mother's ethnicity: *** Father's ethnicity: *** Consangunity: ***Denies   Prior Genetic testing: ***  Genetic Counseling: Jaliel Deavers is a 52 y.o. male with ***.  he presents to clinic today ***for/with ***   Recommendations: ***  Date: 07/20/2023 Time: *** Total time spent: ***  Lambert Mody MS [facility] Certified Genetic Counselor [facility]  I have personally counseled the patient/family, spending > 50% of total time on counseling and coordination of care as outlined.

## 2023-07-30 DIAGNOSIS — R809 Proteinuria, unspecified: Secondary | ICD-10-CM | POA: Diagnosis not present

## 2023-07-30 DIAGNOSIS — N1832 Chronic kidney disease, stage 3b: Secondary | ICD-10-CM | POA: Diagnosis not present

## 2023-07-30 DIAGNOSIS — I129 Hypertensive chronic kidney disease with stage 1 through stage 4 chronic kidney disease, or unspecified chronic kidney disease: Secondary | ICD-10-CM | POA: Diagnosis not present

## 2023-07-31 LAB — LAB REPORT - SCANNED
Albumin, Urine POC: 283.7
Creatinine, POC: 86.2 mg/dL
EGFR: 41
Microalb Creat Ratio: 329

## 2023-08-02 ENCOUNTER — Telehealth: Payer: Self-pay | Admitting: Genetic Counselor

## 2023-08-02 NOTE — Progress Notes (Signed)
Attempted to call Mr. Jason [name]. Left voicemail requesting a callback to discuss results of genetic testing.   Tilda Franco, MS [facility] Certified Genetic Counselor

## 2023-08-04 NOTE — Progress Notes (Signed)
Spoke with Mr.Crewe regarding the results of his recent genetic testing.  To review, Mr. Jorde was seen in [facility] due to a personal and family history of hypercholesterolemia.   The [facility] Familial Hypercholesterolemia Panel (4 genes) was negative/normal.  At this time, we have not identified a genetic explanation for his personal and family history of hypercholesterolemia.  No changes to medical management or testing of other family members is recommended at this time.   We discussed that the cause of his personal and family history of hypercholesterolemia is likely multifactorial, meaning many environmental and genetic factors interacting together with no single item being the sole cause.  He should continue to follow with his current medical providers as recommended for management of his hypercholesterolemia.   Mr. Kingham expressed understanding of these results and was encouraged to reach out with any other questions.  The test report has been released to him and is attached to the associated order.   Lambert Mody, MS [facility]  Certified Dentist

## 2023-09-13 ENCOUNTER — Other Ambulatory Visit: Payer: Self-pay | Admitting: Pharmacist

## 2023-11-17 DIAGNOSIS — R7303 Prediabetes: Secondary | ICD-10-CM | POA: Diagnosis not present

## 2023-11-17 DIAGNOSIS — M25562 Pain in left knee: Secondary | ICD-10-CM | POA: Diagnosis not present

## 2023-11-17 DIAGNOSIS — E7800 Pure hypercholesterolemia, unspecified: Secondary | ICD-10-CM | POA: Diagnosis not present

## 2023-11-17 DIAGNOSIS — Z0000 Encounter for general adult medical examination without abnormal findings: Secondary | ICD-10-CM | POA: Diagnosis not present

## 2023-11-17 DIAGNOSIS — Z125 Encounter for screening for malignant neoplasm of prostate: Secondary | ICD-10-CM | POA: Diagnosis not present

## 2023-11-17 DIAGNOSIS — Z79899 Other long term (current) drug therapy: Secondary | ICD-10-CM | POA: Diagnosis not present

## 2023-11-17 DIAGNOSIS — M5432 Sciatica, left side: Secondary | ICD-10-CM | POA: Diagnosis not present

## 2023-11-17 DIAGNOSIS — M1A9XX1 Chronic gout, unspecified, with tophus (tophi): Secondary | ICD-10-CM | POA: Diagnosis not present

## 2023-11-17 DIAGNOSIS — I10 Essential (primary) hypertension: Secondary | ICD-10-CM | POA: Diagnosis not present

## 2023-12-10 ENCOUNTER — Telehealth: Payer: Self-pay

## 2023-12-10 ENCOUNTER — Other Ambulatory Visit (HOSPITAL_COMMUNITY): Payer: Self-pay

## 2023-12-10 NOTE — Telephone Encounter (Signed)
 Pharmacy Patient Advocate Encounter  Received notification from [facility] that Prior Authorization for REPATHA has been APPROVED from 12/10/23 to 12/09/24. Ran test claim, Copay is $30. This test claim was processed through [facility]- copay amounts may vary at other pharmacies due to pharmacy/plan contracts, or as the patient moves through the different stages of their insurance plan.

## 2023-12-10 NOTE — Telephone Encounter (Signed)
 Pharmacy Patient Advocate Encounter   Received notification from CoverMyMeds that prior authorization for REPATHA is required/requested.   Insurance verification completed.   The patient is insured through [facility] .   Per test claim: PA required; PA submitted to above mentioned insurance via CoverMyMeds Key/confirmation #/[id number] Status is pending

## 2024-01-19 DIAGNOSIS — H52223 Regular astigmatism, bilateral: Secondary | ICD-10-CM | POA: Diagnosis not present

## 2024-01-19 DIAGNOSIS — H35713 Central serous chorioretinopathy, bilateral: Secondary | ICD-10-CM | POA: Diagnosis not present

## 2024-01-19 DIAGNOSIS — H40013 Open angle with borderline findings, low risk, bilateral: Secondary | ICD-10-CM | POA: Diagnosis not present

## 2024-01-19 DIAGNOSIS — H524 Presbyopia: Secondary | ICD-10-CM | POA: Diagnosis not present

## 2024-01-28 DIAGNOSIS — N1832 Chronic kidney disease, stage 3b: Secondary | ICD-10-CM | POA: Diagnosis not present

## 2024-01-28 DIAGNOSIS — I129 Hypertensive chronic kidney disease with stage 1 through stage 4 chronic kidney disease, or unspecified chronic kidney disease: Secondary | ICD-10-CM | POA: Diagnosis not present

## 2024-01-28 DIAGNOSIS — R809 Proteinuria, unspecified: Secondary | ICD-10-CM | POA: Diagnosis not present

## 2024-01-29 LAB — LAB REPORT - SCANNED
Albumin, Urine POC: 183.7
Creatinine, POC: 117.3 mg/dL
EGFR: 44
Microalb Creat Ratio: 157

## 2024-02-04 ENCOUNTER — Other Ambulatory Visit: Payer: Self-pay

## 2024-04-11 ENCOUNTER — Other Ambulatory Visit: Payer: Self-pay

## 2024-05-17 DIAGNOSIS — M79641 Pain in right hand: Secondary | ICD-10-CM | POA: Diagnosis not present

## 2024-05-17 DIAGNOSIS — M79642 Pain in left hand: Secondary | ICD-10-CM | POA: Diagnosis not present

## 2024-05-17 DIAGNOSIS — Z79899 Other long term (current) drug therapy: Secondary | ICD-10-CM | POA: Diagnosis not present

## 2024-05-17 DIAGNOSIS — M1A9XX1 Chronic gout, unspecified, with tophus (tophi): Secondary | ICD-10-CM | POA: Diagnosis not present

## 2024-05-19 DIAGNOSIS — E7800 Pure hypercholesterolemia, unspecified: Secondary | ICD-10-CM | POA: Diagnosis not present

## 2024-05-19 DIAGNOSIS — I10 Essential (primary) hypertension: Secondary | ICD-10-CM | POA: Diagnosis not present

## 2024-05-19 DIAGNOSIS — R7303 Prediabetes: Secondary | ICD-10-CM | POA: Diagnosis not present

## 2024-05-19 DIAGNOSIS — R809 Proteinuria, unspecified: Secondary | ICD-10-CM | POA: Diagnosis not present

## 2024-05-19 DIAGNOSIS — M1310 Monoarthritis, not elsewhere classified, unspecified site: Secondary | ICD-10-CM | POA: Diagnosis not present

## 2024-07-05 ENCOUNTER — Other Ambulatory Visit (HOSPITAL_COMMUNITY): Payer: Self-pay

## 2024-07-05 MED ORDER — FLUZONE 0.5 ML IM SUSY
0.5000 mL | PREFILLED_SYRINGE | Freq: Once | INTRAMUSCULAR | 0 refills | Status: AC
  Filled 2024-07-05: qty 0.5, 1d supply, fill #0

## 2024-08-14 ENCOUNTER — Other Ambulatory Visit: Payer: Self-pay | Admitting: Cardiology

## 2024-09-12 ENCOUNTER — Other Ambulatory Visit: Payer: Self-pay | Admitting: Cardiology

## 2024-10-13 ENCOUNTER — Other Ambulatory Visit: Payer: Self-pay | Admitting: Cardiology
